# Patient Record
Sex: Male | Born: 1950
Health system: Southern US, Community
[De-identification: ages and names within clinical notes are randomized; demographics above are authoritative.]

## PROBLEM LIST (undated history)

## (undated) DIAGNOSIS — K56609 Unspecified intestinal obstruction, unspecified as to partial versus complete obstruction: Secondary | ICD-10-CM

## (undated) DIAGNOSIS — I219 Acute myocardial infarction, unspecified: Secondary | ICD-10-CM

## (undated) DIAGNOSIS — I2510 Atherosclerotic heart disease of native coronary artery without angina pectoris: Secondary | ICD-10-CM

## (undated) DIAGNOSIS — I10 Essential (primary) hypertension: Secondary | ICD-10-CM

## (undated) DIAGNOSIS — E041 Nontoxic single thyroid nodule: Secondary | ICD-10-CM

## (undated) DIAGNOSIS — N400 Enlarged prostate without lower urinary tract symptoms: Secondary | ICD-10-CM

## (undated) DIAGNOSIS — M549 Dorsalgia, unspecified: Secondary | ICD-10-CM

## (undated) DIAGNOSIS — R918 Other nonspecific abnormal finding of lung field: Secondary | ICD-10-CM

## (undated) DIAGNOSIS — C679 Malignant neoplasm of bladder, unspecified: Secondary | ICD-10-CM

## (undated) DIAGNOSIS — I771 Stricture of artery: Secondary | ICD-10-CM

## (undated) HISTORY — DX: Dorsalgia, unspecified: M54.9

## (undated) HISTORY — PX: CIRCUMCISION: SUR203

## (undated) HISTORY — DX: Stricture of artery: I77.1

## (undated) HISTORY — PX: CORONARY ANGIOPLASTY WITH STENT PLACEMENT: SHX49

## (undated) HISTORY — DX: Malignant neoplasm of bladder, unspecified: C67.9

## (undated) HISTORY — PX: TRANSURETHRAL RESECTION OF BLADDER TUMOR WITH GYRUS (TURBT-GYRUS): SHX6458

## (undated) HISTORY — DX: Benign prostatic hyperplasia without lower urinary tract symptoms: N40.0

## (undated) HISTORY — PX: CYSTECTOMY: SUR359

## (undated) HISTORY — DX: Atherosclerotic heart disease of native coronary artery without angina pectoris: I25.10

## (undated) HISTORY — DX: Essential (primary) hypertension: I10

## (undated) HISTORY — DX: Nontoxic single thyroid nodule: E04.1

## (undated) HISTORY — PX: NASAL SINUS SURGERY: SHX719

## (undated) HISTORY — DX: Acute myocardial infarction, unspecified: I21.9

## (undated) HISTORY — DX: Other nonspecific abnormal finding of lung field: R91.8

## (undated) HISTORY — DX: Unspecified intestinal obstruction, unspecified as to partial versus complete obstruction: K56.609

---

## 2009-10-07 ENCOUNTER — Ambulatory Visit: Payer: Self-pay | Admitting: Internal Medicine

## 2010-10-10 ENCOUNTER — Ambulatory Visit: Payer: Self-pay | Admitting: Urology

## 2010-11-15 ENCOUNTER — Ambulatory Visit: Payer: Self-pay | Admitting: Urology

## 2010-11-20 ENCOUNTER — Ambulatory Visit: Payer: Self-pay | Admitting: Urology

## 2010-11-22 LAB — PATHOLOGY REPORT

## 2011-01-24 DIAGNOSIS — C679 Malignant neoplasm of bladder, unspecified: Secondary | ICD-10-CM | POA: Diagnosis not present

## 2011-01-24 DIAGNOSIS — I10 Essential (primary) hypertension: Secondary | ICD-10-CM | POA: Diagnosis not present

## 2011-02-05 DIAGNOSIS — C679 Malignant neoplasm of bladder, unspecified: Secondary | ICD-10-CM | POA: Diagnosis not present

## 2011-02-05 DIAGNOSIS — I10 Essential (primary) hypertension: Secondary | ICD-10-CM | POA: Diagnosis not present

## 2011-03-05 ENCOUNTER — Ambulatory Visit: Payer: Self-pay | Admitting: Urology

## 2011-03-05 DIAGNOSIS — Z7982 Long term (current) use of aspirin: Secondary | ICD-10-CM | POA: Diagnosis not present

## 2011-03-05 DIAGNOSIS — N419 Inflammatory disease of prostate, unspecified: Secondary | ICD-10-CM | POA: Diagnosis not present

## 2011-03-05 DIAGNOSIS — M545 Low back pain, unspecified: Secondary | ICD-10-CM | POA: Diagnosis not present

## 2011-03-05 DIAGNOSIS — I10 Essential (primary) hypertension: Secondary | ICD-10-CM | POA: Diagnosis not present

## 2011-03-05 DIAGNOSIS — Z79899 Other long term (current) drug therapy: Secondary | ICD-10-CM | POA: Diagnosis not present

## 2011-03-05 DIAGNOSIS — Z9861 Coronary angioplasty status: Secondary | ICD-10-CM | POA: Diagnosis not present

## 2011-03-05 DIAGNOSIS — N400 Enlarged prostate without lower urinary tract symptoms: Secondary | ICD-10-CM | POA: Diagnosis not present

## 2011-03-05 DIAGNOSIS — I252 Old myocardial infarction: Secondary | ICD-10-CM | POA: Diagnosis not present

## 2011-03-05 DIAGNOSIS — F17200 Nicotine dependence, unspecified, uncomplicated: Secondary | ICD-10-CM | POA: Diagnosis not present

## 2011-03-05 DIAGNOSIS — C679 Malignant neoplasm of bladder, unspecified: Secondary | ICD-10-CM | POA: Diagnosis not present

## 2011-03-05 DIAGNOSIS — K3189 Other diseases of stomach and duodenum: Secondary | ICD-10-CM | POA: Diagnosis not present

## 2011-03-05 DIAGNOSIS — M129 Arthropathy, unspecified: Secondary | ICD-10-CM | POA: Diagnosis not present

## 2011-03-08 DIAGNOSIS — C679 Malignant neoplasm of bladder, unspecified: Secondary | ICD-10-CM | POA: Diagnosis not present

## 2011-03-08 DIAGNOSIS — I10 Essential (primary) hypertension: Secondary | ICD-10-CM | POA: Diagnosis not present

## 2011-03-08 LAB — PATHOLOGY REPORT

## 2011-03-12 DIAGNOSIS — F332 Major depressive disorder, recurrent severe without psychotic features: Secondary | ICD-10-CM | POA: Diagnosis not present

## 2011-03-12 DIAGNOSIS — F314 Bipolar disorder, current episode depressed, severe, without psychotic features: Secondary | ICD-10-CM | POA: Diagnosis not present

## 2011-03-14 DIAGNOSIS — C679 Malignant neoplasm of bladder, unspecified: Secondary | ICD-10-CM | POA: Diagnosis not present

## 2011-03-14 DIAGNOSIS — I10 Essential (primary) hypertension: Secondary | ICD-10-CM | POA: Diagnosis not present

## 2011-03-20 DIAGNOSIS — F4310 Post-traumatic stress disorder, unspecified: Secondary | ICD-10-CM | POA: Diagnosis not present

## 2011-03-28 DIAGNOSIS — I10 Essential (primary) hypertension: Secondary | ICD-10-CM | POA: Diagnosis not present

## 2011-03-28 DIAGNOSIS — C679 Malignant neoplasm of bladder, unspecified: Secondary | ICD-10-CM | POA: Diagnosis not present

## 2011-04-04 DIAGNOSIS — I10 Essential (primary) hypertension: Secondary | ICD-10-CM | POA: Diagnosis not present

## 2011-04-04 DIAGNOSIS — E785 Hyperlipidemia, unspecified: Secondary | ICD-10-CM | POA: Diagnosis not present

## 2011-04-04 DIAGNOSIS — K219 Gastro-esophageal reflux disease without esophagitis: Secondary | ICD-10-CM | POA: Diagnosis not present

## 2011-04-04 DIAGNOSIS — J309 Allergic rhinitis, unspecified: Secondary | ICD-10-CM | POA: Diagnosis not present

## 2011-05-01 DIAGNOSIS — F4310 Post-traumatic stress disorder, unspecified: Secondary | ICD-10-CM | POA: Diagnosis not present

## 2011-06-12 DIAGNOSIS — F332 Major depressive disorder, recurrent severe without psychotic features: Secondary | ICD-10-CM | POA: Diagnosis not present

## 2011-06-19 DIAGNOSIS — F4310 Post-traumatic stress disorder, unspecified: Secondary | ICD-10-CM | POA: Diagnosis not present

## 2011-06-27 DIAGNOSIS — M62838 Other muscle spasm: Secondary | ICD-10-CM | POA: Diagnosis not present

## 2011-06-27 DIAGNOSIS — I10 Essential (primary) hypertension: Secondary | ICD-10-CM | POA: Diagnosis not present

## 2011-06-27 DIAGNOSIS — C679 Malignant neoplasm of bladder, unspecified: Secondary | ICD-10-CM | POA: Diagnosis not present

## 2011-07-04 DIAGNOSIS — F3132 Bipolar disorder, current episode depressed, moderate: Secondary | ICD-10-CM | POA: Diagnosis not present

## 2011-07-04 DIAGNOSIS — F332 Major depressive disorder, recurrent severe without psychotic features: Secondary | ICD-10-CM | POA: Diagnosis not present

## 2011-07-04 DIAGNOSIS — F4310 Post-traumatic stress disorder, unspecified: Secondary | ICD-10-CM | POA: Diagnosis not present

## 2011-07-31 DIAGNOSIS — F4310 Post-traumatic stress disorder, unspecified: Secondary | ICD-10-CM | POA: Diagnosis not present

## 2011-08-21 DIAGNOSIS — M109 Gout, unspecified: Secondary | ICD-10-CM | POA: Diagnosis not present

## 2011-10-02 DIAGNOSIS — F4310 Post-traumatic stress disorder, unspecified: Secondary | ICD-10-CM | POA: Diagnosis not present

## 2011-10-17 DIAGNOSIS — C679 Malignant neoplasm of bladder, unspecified: Secondary | ICD-10-CM | POA: Diagnosis not present

## 2011-10-17 DIAGNOSIS — I10 Essential (primary) hypertension: Secondary | ICD-10-CM | POA: Diagnosis not present

## 2011-10-18 ENCOUNTER — Ambulatory Visit: Payer: Self-pay | Admitting: Urology

## 2011-10-18 DIAGNOSIS — Z01812 Encounter for preprocedural laboratory examination: Secondary | ICD-10-CM | POA: Diagnosis not present

## 2011-10-18 DIAGNOSIS — I10 Essential (primary) hypertension: Secondary | ICD-10-CM | POA: Diagnosis not present

## 2011-10-18 DIAGNOSIS — Z01810 Encounter for preprocedural cardiovascular examination: Secondary | ICD-10-CM | POA: Diagnosis not present

## 2011-10-18 DIAGNOSIS — I2510 Atherosclerotic heart disease of native coronary artery without angina pectoris: Secondary | ICD-10-CM | POA: Diagnosis not present

## 2011-10-18 LAB — CBC WITH DIFFERENTIAL/PLATELET
Basophil #: 0 10*3/uL (ref 0.0–0.1)
Eosinophil #: 0.1 10*3/uL (ref 0.0–0.7)
HCT: 40.7 % (ref 40.0–52.0)
Lymphocyte #: 2.4 10*3/uL (ref 1.0–3.6)
Lymphocyte %: 38.3 %
MCHC: 33.5 g/dL (ref 32.0–36.0)
MCV: 94 fL (ref 80–100)
Neutrophil #: 3.1 10*3/uL (ref 1.4–6.5)
Platelet: 209 10*3/uL (ref 150–440)
RDW: 15 % — ABNORMAL HIGH (ref 11.5–14.5)

## 2011-10-18 LAB — BASIC METABOLIC PANEL
BUN: 6 mg/dL — ABNORMAL LOW (ref 7–18)
Creatinine: 0.85 mg/dL (ref 0.60–1.30)
EGFR (Non-African Amer.): >60
Glucose: 102 mg/dL — ABNORMAL HIGH (ref 65–99)
Osmolality: 285 (ref 275–301)
Potassium: 4 mmol/L (ref 3.5–5.1)
Sodium: 144 mmol/L (ref 136–145)

## 2011-10-22 ENCOUNTER — Ambulatory Visit: Payer: Self-pay | Admitting: Urology

## 2011-10-22 DIAGNOSIS — Z7982 Long term (current) use of aspirin: Secondary | ICD-10-CM | POA: Diagnosis not present

## 2011-10-22 DIAGNOSIS — I10 Essential (primary) hypertension: Secondary | ICD-10-CM | POA: Diagnosis not present

## 2011-10-22 DIAGNOSIS — K3189 Other diseases of stomach and duodenum: Secondary | ICD-10-CM | POA: Diagnosis not present

## 2011-10-22 DIAGNOSIS — M545 Low back pain, unspecified: Secondary | ICD-10-CM | POA: Diagnosis not present

## 2011-10-22 DIAGNOSIS — I2510 Atherosclerotic heart disease of native coronary artery without angina pectoris: Secondary | ICD-10-CM | POA: Diagnosis not present

## 2011-10-22 DIAGNOSIS — F17200 Nicotine dependence, unspecified, uncomplicated: Secondary | ICD-10-CM | POA: Diagnosis not present

## 2011-10-22 DIAGNOSIS — Z9861 Coronary angioplasty status: Secondary | ICD-10-CM | POA: Diagnosis not present

## 2011-10-22 DIAGNOSIS — N419 Inflammatory disease of prostate, unspecified: Secondary | ICD-10-CM | POA: Diagnosis not present

## 2011-10-22 DIAGNOSIS — C679 Malignant neoplasm of bladder, unspecified: Secondary | ICD-10-CM | POA: Diagnosis not present

## 2011-10-22 DIAGNOSIS — N400 Enlarged prostate without lower urinary tract symptoms: Secondary | ICD-10-CM | POA: Diagnosis not present

## 2011-10-22 DIAGNOSIS — D494 Neoplasm of unspecified behavior of bladder: Secondary | ICD-10-CM | POA: Diagnosis not present

## 2011-10-22 DIAGNOSIS — Z79899 Other long term (current) drug therapy: Secondary | ICD-10-CM | POA: Diagnosis not present

## 2011-10-22 DIAGNOSIS — I252 Old myocardial infarction: Secondary | ICD-10-CM | POA: Diagnosis not present

## 2011-10-22 DIAGNOSIS — F4310 Post-traumatic stress disorder, unspecified: Secondary | ICD-10-CM | POA: Diagnosis not present

## 2011-11-20 DIAGNOSIS — F4310 Post-traumatic stress disorder, unspecified: Secondary | ICD-10-CM | POA: Diagnosis not present

## 2011-12-18 DIAGNOSIS — F4310 Post-traumatic stress disorder, unspecified: Secondary | ICD-10-CM | POA: Diagnosis not present

## 2012-02-13 DIAGNOSIS — F4310 Post-traumatic stress disorder, unspecified: Secondary | ICD-10-CM | POA: Diagnosis not present

## 2012-02-19 DIAGNOSIS — F4310 Post-traumatic stress disorder, unspecified: Secondary | ICD-10-CM | POA: Diagnosis not present

## 2012-04-02 DIAGNOSIS — C679 Malignant neoplasm of bladder, unspecified: Secondary | ICD-10-CM | POA: Diagnosis not present

## 2012-04-08 DIAGNOSIS — E785 Hyperlipidemia, unspecified: Secondary | ICD-10-CM | POA: Diagnosis not present

## 2012-04-08 DIAGNOSIS — Z0000 Encounter for general adult medical examination without abnormal findings: Secondary | ICD-10-CM | POA: Diagnosis not present

## 2012-04-08 DIAGNOSIS — Z23 Encounter for immunization: Secondary | ICD-10-CM | POA: Diagnosis not present

## 2012-04-08 DIAGNOSIS — I10 Essential (primary) hypertension: Secondary | ICD-10-CM | POA: Diagnosis not present

## 2012-04-08 DIAGNOSIS — Z125 Encounter for screening for malignant neoplasm of prostate: Secondary | ICD-10-CM | POA: Diagnosis not present

## 2012-04-15 DIAGNOSIS — F4310 Post-traumatic stress disorder, unspecified: Secondary | ICD-10-CM | POA: Diagnosis not present

## 2012-04-29 DIAGNOSIS — C679 Malignant neoplasm of bladder, unspecified: Secondary | ICD-10-CM | POA: Diagnosis not present

## 2012-04-29 DIAGNOSIS — N400 Enlarged prostate without lower urinary tract symptoms: Secondary | ICD-10-CM | POA: Diagnosis not present

## 2012-04-29 DIAGNOSIS — R972 Elevated prostate specific antigen [PSA]: Secondary | ICD-10-CM | POA: Diagnosis not present

## 2012-05-05 ENCOUNTER — Ambulatory Visit: Payer: Self-pay | Admitting: Urology

## 2012-05-05 DIAGNOSIS — R310 Gross hematuria: Secondary | ICD-10-CM | POA: Diagnosis not present

## 2012-05-05 DIAGNOSIS — R319 Hematuria, unspecified: Secondary | ICD-10-CM | POA: Diagnosis not present

## 2012-05-08 HISTORY — PX: INSERTION CENTRAL VENOUS ACCESS DEVICE W/ SUBCUTANEOUS PORT: SUR725

## 2012-05-13 ENCOUNTER — Ambulatory Visit: Payer: Self-pay | Admitting: Urology

## 2012-05-13 DIAGNOSIS — I771 Stricture of artery: Secondary | ICD-10-CM | POA: Diagnosis not present

## 2012-05-13 DIAGNOSIS — C679 Malignant neoplasm of bladder, unspecified: Secondary | ICD-10-CM | POA: Diagnosis not present

## 2012-05-13 DIAGNOSIS — I2510 Atherosclerotic heart disease of native coronary artery without angina pectoris: Secondary | ICD-10-CM | POA: Diagnosis not present

## 2012-05-13 DIAGNOSIS — Z01812 Encounter for preprocedural laboratory examination: Secondary | ICD-10-CM | POA: Diagnosis not present

## 2012-05-13 LAB — CBC WITH DIFFERENTIAL/PLATELET
Basophil #: 0 10*3/uL (ref 0.0–0.1)
Basophil %: 0.5 %
Eosinophil #: 0.1 10*3/uL (ref 0.0–0.7)
HGB: 13.2 g/dL (ref 13.0–18.0)
Lymphocyte %: 39.3 %
MCH: 30.8 pg (ref 26.0–34.0)
MCHC: 33.6 g/dL (ref 32.0–36.0)
MCV: 92 fL (ref 80–100)
Monocyte #: 0.6 x10 3/mm (ref 0.2–1.0)
Monocyte %: 10.6 %
Neutrophil #: 2.6 10*3/uL (ref 1.4–6.5)
Neutrophil %: 47.7 %
RBC: 4.29 10*6/uL — ABNORMAL LOW (ref 4.40–5.90)
RDW: 14.3 % (ref 11.5–14.5)

## 2012-05-13 LAB — BASIC METABOLIC PANEL
Anion Gap: 3 — ABNORMAL LOW (ref 7–16)
Calcium, Total: 9.2 mg/dL (ref 8.5–10.1)
Chloride: 107 mmol/L (ref 98–107)
Creatinine: 0.98 mg/dL (ref 0.60–1.30)
EGFR (African American): >60
Glucose: 78 mg/dL (ref 65–99)
Potassium: 4.2 mmol/L (ref 3.5–5.1)

## 2012-05-21 ENCOUNTER — Ambulatory Visit: Payer: Self-pay | Admitting: Urology

## 2012-05-21 DIAGNOSIS — C679 Malignant neoplasm of bladder, unspecified: Secondary | ICD-10-CM | POA: Diagnosis not present

## 2012-05-21 DIAGNOSIS — I252 Old myocardial infarction: Secondary | ICD-10-CM | POA: Diagnosis not present

## 2012-05-21 DIAGNOSIS — Z880 Allergy status to penicillin: Secondary | ICD-10-CM | POA: Diagnosis not present

## 2012-05-21 DIAGNOSIS — N329 Bladder disorder, unspecified: Secondary | ICD-10-CM | POA: Diagnosis not present

## 2012-05-21 DIAGNOSIS — F17200 Nicotine dependence, unspecified, uncomplicated: Secondary | ICD-10-CM | POA: Diagnosis not present

## 2012-05-21 DIAGNOSIS — I10 Essential (primary) hypertension: Secondary | ICD-10-CM | POA: Diagnosis not present

## 2012-05-21 DIAGNOSIS — N400 Enlarged prostate without lower urinary tract symptoms: Secondary | ICD-10-CM | POA: Diagnosis not present

## 2012-05-21 DIAGNOSIS — Z7982 Long term (current) use of aspirin: Secondary | ICD-10-CM | POA: Diagnosis not present

## 2012-05-21 DIAGNOSIS — Z79899 Other long term (current) drug therapy: Secondary | ICD-10-CM | POA: Diagnosis not present

## 2012-05-22 LAB — PATHOLOGY REPORT

## 2012-06-05 ENCOUNTER — Ambulatory Visit: Payer: Self-pay | Admitting: Hematology and Oncology

## 2012-06-05 DIAGNOSIS — C679 Malignant neoplasm of bladder, unspecified: Secondary | ICD-10-CM | POA: Diagnosis not present

## 2012-06-09 ENCOUNTER — Ambulatory Visit: Payer: Self-pay | Admitting: Urology

## 2012-06-09 DIAGNOSIS — J438 Other emphysema: Secondary | ICD-10-CM | POA: Diagnosis not present

## 2012-06-09 DIAGNOSIS — E785 Hyperlipidemia, unspecified: Secondary | ICD-10-CM | POA: Diagnosis not present

## 2012-06-09 DIAGNOSIS — I6529 Occlusion and stenosis of unspecified carotid artery: Secondary | ICD-10-CM | POA: Diagnosis not present

## 2012-06-09 DIAGNOSIS — I70219 Atherosclerosis of native arteries of extremities with intermittent claudication, unspecified extremity: Secondary | ICD-10-CM | POA: Diagnosis not present

## 2012-06-09 DIAGNOSIS — C679 Malignant neoplasm of bladder, unspecified: Secondary | ICD-10-CM | POA: Diagnosis not present

## 2012-06-09 DIAGNOSIS — R319 Hematuria, unspecified: Secondary | ICD-10-CM | POA: Diagnosis not present

## 2012-06-09 DIAGNOSIS — I10 Essential (primary) hypertension: Secondary | ICD-10-CM | POA: Diagnosis not present

## 2012-06-11 ENCOUNTER — Ambulatory Visit: Payer: Self-pay | Admitting: Hematology and Oncology

## 2012-06-11 DIAGNOSIS — K5900 Constipation, unspecified: Secondary | ICD-10-CM | POA: Diagnosis not present

## 2012-06-11 DIAGNOSIS — Z800 Family history of malignant neoplasm of digestive organs: Secondary | ICD-10-CM | POA: Diagnosis not present

## 2012-06-11 DIAGNOSIS — R300 Dysuria: Secondary | ICD-10-CM | POA: Diagnosis not present

## 2012-06-11 DIAGNOSIS — Z9889 Other specified postprocedural states: Secondary | ICD-10-CM | POA: Diagnosis not present

## 2012-06-11 DIAGNOSIS — R509 Fever, unspecified: Secondary | ICD-10-CM | POA: Diagnosis not present

## 2012-06-11 DIAGNOSIS — R319 Hematuria, unspecified: Secondary | ICD-10-CM | POA: Diagnosis not present

## 2012-06-11 DIAGNOSIS — F17200 Nicotine dependence, unspecified, uncomplicated: Secondary | ICD-10-CM | POA: Diagnosis not present

## 2012-06-11 DIAGNOSIS — C679 Malignant neoplasm of bladder, unspecified: Secondary | ICD-10-CM | POA: Diagnosis not present

## 2012-06-11 DIAGNOSIS — IMO0002 Reserved for concepts with insufficient information to code with codable children: Secondary | ICD-10-CM | POA: Diagnosis not present

## 2012-06-11 DIAGNOSIS — Z8041 Family history of malignant neoplasm of ovary: Secondary | ICD-10-CM | POA: Diagnosis not present

## 2012-06-11 DIAGNOSIS — Z8042 Family history of malignant neoplasm of prostate: Secondary | ICD-10-CM | POA: Diagnosis not present

## 2012-06-11 DIAGNOSIS — Z5111 Encounter for antineoplastic chemotherapy: Secondary | ICD-10-CM | POA: Diagnosis not present

## 2012-06-11 DIAGNOSIS — Z7982 Long term (current) use of aspirin: Secondary | ICD-10-CM | POA: Diagnosis not present

## 2012-06-11 DIAGNOSIS — R110 Nausea: Secondary | ICD-10-CM | POA: Diagnosis not present

## 2012-06-11 DIAGNOSIS — R42 Dizziness and giddiness: Secondary | ICD-10-CM | POA: Diagnosis not present

## 2012-06-11 DIAGNOSIS — Z79899 Other long term (current) drug therapy: Secondary | ICD-10-CM | POA: Diagnosis not present

## 2012-06-11 DIAGNOSIS — Z808 Family history of malignant neoplasm of other organs or systems: Secondary | ICD-10-CM | POA: Diagnosis not present

## 2012-06-17 DIAGNOSIS — F4310 Post-traumatic stress disorder, unspecified: Secondary | ICD-10-CM | POA: Diagnosis not present

## 2012-06-20 ENCOUNTER — Ambulatory Visit: Payer: Self-pay | Admitting: Vascular Surgery

## 2012-06-20 DIAGNOSIS — I519 Heart disease, unspecified: Secondary | ICD-10-CM | POA: Diagnosis not present

## 2012-06-20 DIAGNOSIS — Z79899 Other long term (current) drug therapy: Secondary | ICD-10-CM | POA: Diagnosis not present

## 2012-06-20 DIAGNOSIS — I10 Essential (primary) hypertension: Secondary | ICD-10-CM | POA: Diagnosis not present

## 2012-06-20 DIAGNOSIS — E785 Hyperlipidemia, unspecified: Secondary | ICD-10-CM | POA: Diagnosis not present

## 2012-06-20 DIAGNOSIS — C679 Malignant neoplasm of bladder, unspecified: Secondary | ICD-10-CM | POA: Diagnosis not present

## 2012-06-20 DIAGNOSIS — Z9889 Other specified postprocedural states: Secondary | ICD-10-CM | POA: Diagnosis not present

## 2012-06-23 DIAGNOSIS — Z5111 Encounter for antineoplastic chemotherapy: Secondary | ICD-10-CM | POA: Diagnosis not present

## 2012-06-23 DIAGNOSIS — R300 Dysuria: Secondary | ICD-10-CM | POA: Diagnosis not present

## 2012-06-23 DIAGNOSIS — C679 Malignant neoplasm of bladder, unspecified: Secondary | ICD-10-CM | POA: Diagnosis not present

## 2012-06-23 DIAGNOSIS — R319 Hematuria, unspecified: Secondary | ICD-10-CM | POA: Diagnosis not present

## 2012-06-23 DIAGNOSIS — R42 Dizziness and giddiness: Secondary | ICD-10-CM | POA: Diagnosis not present

## 2012-06-23 DIAGNOSIS — IMO0002 Reserved for concepts with insufficient information to code with codable children: Secondary | ICD-10-CM | POA: Diagnosis not present

## 2012-06-23 LAB — COMPREHENSIVE METABOLIC PANEL
Albumin: 3.4 g/dL (ref 3.4–5.0)
Alkaline Phosphatase: 76 U/L (ref 50–136)
Anion Gap: 7 (ref 7–16)
BUN: 10 mg/dL (ref 7–18)
Bilirubin,Total: 0.5 mg/dL (ref 0.2–1.0)
Calcium, Total: 9.5 mg/dL (ref 8.5–10.1)
Co2: 30 mmol/L (ref 21–32)
Glucose: 117 mg/dL — ABNORMAL HIGH (ref 65–99)
Osmolality: 272 (ref 275–301)
Potassium: 4.3 mmol/L (ref 3.5–5.1)
SGOT(AST): 26 U/L (ref 15–37)
Sodium: 136 mmol/L (ref 136–145)
Total Protein: 7.6 g/dL (ref 6.4–8.2)

## 2012-06-23 LAB — CBC CANCER CENTER
Basophil #: 0.1 x10 3/mm (ref 0.0–0.1)
Basophil %: 1.2 %
Eosinophil #: 0.1 x10 3/mm (ref 0.0–0.7)
HCT: 37.5 % — ABNORMAL LOW (ref 40.0–52.0)
MCH: 29.4 pg (ref 26.0–34.0)
MCHC: 32.3 g/dL (ref 32.0–36.0)
Monocyte #: 0.5 x10 3/mm (ref 0.2–1.0)
Monocyte %: 10 %
Neutrophil #: 2.3 x10 3/mm (ref 1.4–6.5)
Platelet: 202 x10 3/mm (ref 150–440)

## 2012-06-30 DIAGNOSIS — R300 Dysuria: Secondary | ICD-10-CM | POA: Diagnosis not present

## 2012-06-30 DIAGNOSIS — R319 Hematuria, unspecified: Secondary | ICD-10-CM | POA: Diagnosis not present

## 2012-06-30 DIAGNOSIS — C679 Malignant neoplasm of bladder, unspecified: Secondary | ICD-10-CM | POA: Diagnosis not present

## 2012-06-30 DIAGNOSIS — IMO0002 Reserved for concepts with insufficient information to code with codable children: Secondary | ICD-10-CM | POA: Diagnosis not present

## 2012-06-30 DIAGNOSIS — R42 Dizziness and giddiness: Secondary | ICD-10-CM | POA: Diagnosis not present

## 2012-06-30 DIAGNOSIS — Z5111 Encounter for antineoplastic chemotherapy: Secondary | ICD-10-CM | POA: Diagnosis not present

## 2012-06-30 LAB — URINALYSIS, COMPLETE
Bilirubin,UR: NEGATIVE
Ketone: NEGATIVE
Nitrite: NEGATIVE
Ph: 6 (ref 4.5–8.0)
Protein: 30
Specific Gravity: 1.005 (ref 1.003–1.030)

## 2012-06-30 LAB — CBC CANCER CENTER
Basophil #: 0 x10 3/mm (ref 0.0–0.1)
Basophil %: 0.3 %
Eosinophil #: 0 x10 3/mm (ref 0.0–0.7)
Eosinophil %: 1.2 %
Lymphocyte #: 1.6 x10 3/mm (ref 1.0–3.6)
MCH: 30 pg (ref 26.0–34.0)
Monocyte %: 4.8 %
Neutrophil #: 1.5 x10 3/mm (ref 1.4–6.5)
Platelet: 145 x10 3/mm — ABNORMAL LOW (ref 150–440)
RDW: 13.9 % (ref 11.5–14.5)

## 2012-06-30 LAB — BASIC METABOLIC PANEL
BUN: 13 mg/dL (ref 7–18)
Calcium, Total: 8.8 mg/dL (ref 8.5–10.1)
Chloride: 95 mmol/L — ABNORMAL LOW (ref 98–107)
EGFR (African American): >60
Glucose: 91 mg/dL (ref 65–99)
Potassium: 3.8 mmol/L (ref 3.5–5.1)
Sodium: 134 mmol/L — ABNORMAL LOW (ref 136–145)

## 2012-07-02 LAB — URINE CULTURE

## 2012-07-07 DIAGNOSIS — IMO0002 Reserved for concepts with insufficient information to code with codable children: Secondary | ICD-10-CM | POA: Diagnosis not present

## 2012-07-07 DIAGNOSIS — R300 Dysuria: Secondary | ICD-10-CM | POA: Diagnosis not present

## 2012-07-07 DIAGNOSIS — R319 Hematuria, unspecified: Secondary | ICD-10-CM | POA: Diagnosis not present

## 2012-07-07 DIAGNOSIS — C679 Malignant neoplasm of bladder, unspecified: Secondary | ICD-10-CM | POA: Diagnosis not present

## 2012-07-07 DIAGNOSIS — R42 Dizziness and giddiness: Secondary | ICD-10-CM | POA: Diagnosis not present

## 2012-07-07 DIAGNOSIS — Z5111 Encounter for antineoplastic chemotherapy: Secondary | ICD-10-CM | POA: Diagnosis not present

## 2012-07-07 LAB — CBC CANCER CENTER
Basophil %: 0.1 %
Eosinophil #: 0 x10 3/mm (ref 0.0–0.7)
MCHC: 34.2 g/dL (ref 32.0–36.0)
MCV: 90 fL (ref 80–100)
Neutrophil #: 0.4 x10 3/mm — ABNORMAL LOW (ref 1.4–6.5)
Neutrophil %: 19.8 %
Platelet: 68 x10 3/mm — ABNORMAL LOW (ref 150–440)
RBC: 3.39 10*6/uL — ABNORMAL LOW (ref 4.40–5.90)
RDW: 13.8 % (ref 11.5–14.5)
WBC: 2 x10 3/mm — CL (ref 3.8–10.6)

## 2012-07-08 ENCOUNTER — Ambulatory Visit: Payer: Self-pay | Admitting: Hematology and Oncology

## 2012-07-08 DIAGNOSIS — Z7982 Long term (current) use of aspirin: Secondary | ICD-10-CM | POA: Diagnosis not present

## 2012-07-08 DIAGNOSIS — Z79899 Other long term (current) drug therapy: Secondary | ICD-10-CM | POA: Diagnosis not present

## 2012-07-08 DIAGNOSIS — C679 Malignant neoplasm of bladder, unspecified: Secondary | ICD-10-CM | POA: Diagnosis not present

## 2012-07-08 DIAGNOSIS — F17200 Nicotine dependence, unspecified, uncomplicated: Secondary | ICD-10-CM | POA: Diagnosis not present

## 2012-07-08 DIAGNOSIS — R300 Dysuria: Secondary | ICD-10-CM | POA: Diagnosis not present

## 2012-07-08 DIAGNOSIS — Z5111 Encounter for antineoplastic chemotherapy: Secondary | ICD-10-CM | POA: Diagnosis not present

## 2012-07-14 DIAGNOSIS — B353 Tinea pedis: Secondary | ICD-10-CM | POA: Diagnosis not present

## 2012-07-14 LAB — CBC CANCER CENTER
Basophil #: 0 x10 3/mm (ref 0.0–0.1)
HCT: 34 % — ABNORMAL LOW (ref 40.0–52.0)
HGB: 11.5 g/dL — ABNORMAL LOW (ref 13.0–18.0)
Lymphocyte #: 1.9 x10 3/mm (ref 1.0–3.6)
Lymphocyte %: 33.7 %
MCH: 30.7 pg (ref 26.0–34.0)
MCHC: 33.9 g/dL (ref 32.0–36.0)
MCV: 91 fL (ref 80–100)
Neutrophil #: 2.5 x10 3/mm (ref 1.4–6.5)
Neutrophil %: 45.8 %
RDW: 14.6 % — ABNORMAL HIGH (ref 11.5–14.5)
WBC: 5.5 x10 3/mm (ref 3.8–10.6)

## 2012-07-14 LAB — COMPREHENSIVE METABOLIC PANEL
Albumin: 3.5 g/dL (ref 3.4–5.0)
Alkaline Phosphatase: 80 U/L (ref 50–136)
Anion Gap: 12 (ref 7–16)
BUN: 8 mg/dL (ref 7–18)
Calcium, Total: 9.6 mg/dL (ref 8.5–10.1)
Chloride: 102 mmol/L (ref 98–107)
Co2: 27 mmol/L (ref 21–32)
Creatinine: 1.05 mg/dL (ref 0.60–1.30)
EGFR (Non-African Amer.): >60
Glucose: 113 mg/dL — ABNORMAL HIGH (ref 65–99)
Osmolality: 280 (ref 275–301)

## 2012-07-14 LAB — URINALYSIS, COMPLETE
Ketone: NEGATIVE
Specific Gravity: 1.015 (ref 1.003–1.030)
Squamous Epithelial: NONE SEEN

## 2012-07-21 LAB — CBC CANCER CENTER
Basophil #: 0 x10 3/mm (ref 0.0–0.1)
HCT: 30.7 % — ABNORMAL LOW (ref 40.0–52.0)
HGB: 10.5 g/dL — ABNORMAL LOW (ref 13.0–18.0)
MCH: 30.3 pg (ref 26.0–34.0)
MCHC: 34.1 g/dL (ref 32.0–36.0)
MCV: 89 fL (ref 80–100)
Monocyte %: 14.3 %
Neutrophil #: 1.6 x10 3/mm (ref 1.4–6.5)
Neutrophil %: 39.1 %
Platelet: 363 x10 3/mm (ref 150–440)
RDW: 15 % — ABNORMAL HIGH (ref 11.5–14.5)

## 2012-07-21 LAB — BASIC METABOLIC PANEL
Anion Gap: 6 — ABNORMAL LOW (ref 7–16)
Co2: 31 mmol/L (ref 21–32)
Creatinine: 0.88 mg/dL (ref 0.60–1.30)
EGFR (Non-African Amer.): >60
Osmolality: 271 (ref 275–301)
Potassium: 4.2 mmol/L (ref 3.5–5.1)
Sodium: 136 mmol/L (ref 136–145)

## 2012-07-28 LAB — CBC CANCER CENTER
Basophil #: 0 "x10 3/mm "
Basophil %: 0.1 %
Eosinophil #: 0 "x10 3/mm "
Eosinophil %: 0.7 %
HCT: 28.9 % — ABNORMAL LOW
HGB: 10 g/dL — ABNORMAL LOW
Lymphocyte %: 36.6 %
Lymphs Abs: 1.9 "x10 3/mm "
MCH: 31 pg
MCHC: 34.6 g/dL
MCV: 90 fL
Monocyte #: 1.2 "x10 3/mm " — ABNORMAL HIGH
Monocyte %: 23.8 %
Neutrophil #: 2 "x10 3/mm "
Neutrophil %: 38.8 %
Platelet: 116 "x10 3/mm " — ABNORMAL LOW
RBC: 3.23 "x10 6/mm " — ABNORMAL LOW
RDW: 14.7 % — ABNORMAL HIGH
WBC: 5.1 "x10 3/mm "

## 2012-08-04 LAB — COMPREHENSIVE METABOLIC PANEL
Alkaline Phosphatase: 68 U/L (ref 50–136)
BUN: 6 mg/dL — ABNORMAL LOW (ref 7–18)
Calcium, Total: 8.9 mg/dL (ref 8.5–10.1)
Co2: 30 mmol/L (ref 21–32)
Creatinine: 0.92 mg/dL (ref 0.60–1.30)
EGFR (African American): >60
Glucose: 101 mg/dL — ABNORMAL HIGH (ref 65–99)
Osmolality: 273 (ref 275–301)
SGPT (ALT): 16 U/L (ref 12–78)
Sodium: 138 mmol/L (ref 136–145)
Total Protein: 7.3 g/dL (ref 6.4–8.2)

## 2012-08-04 LAB — CBC CANCER CENTER
Basophil #: 0 x10 3/mm (ref 0.0–0.1)
Basophil %: 0.4 %
Eosinophil %: 1.9 %
HCT: 28.2 % — ABNORMAL LOW (ref 40.0–52.0)
HGB: 9.5 g/dL — ABNORMAL LOW (ref 13.0–18.0)
Lymphocyte #: 1.7 x10 3/mm (ref 1.0–3.6)
MCH: 30.3 pg (ref 26.0–34.0)
MCHC: 33.8 g/dL (ref 32.0–36.0)
MCV: 90 fL (ref 80–100)
Neutrophil #: 2.1 x10 3/mm (ref 1.4–6.5)
Neutrophil %: 39 %
Platelet: 299 x10 3/mm (ref 150–440)
RBC: 3.14 10*6/uL — ABNORMAL LOW (ref 4.40–5.90)

## 2012-08-08 ENCOUNTER — Ambulatory Visit: Payer: Self-pay | Admitting: Hematology and Oncology

## 2012-08-11 LAB — CBC CANCER CENTER
Basophil %: 0.2 %
HCT: 27.3 % — ABNORMAL LOW (ref 40.0–52.0)
Lymphocyte %: 30.6 %
MCH: 30.4 pg (ref 26.0–34.0)
MCHC: 34.6 g/dL (ref 32.0–36.0)
MCV: 88 fL (ref 80–100)
Monocyte #: 0.6 x10 3/mm (ref 0.2–1.0)
Neutrophil %: 51 %
RBC: 3.1 10*6/uL — ABNORMAL LOW (ref 4.40–5.90)
RDW: 15.2 % — ABNORMAL HIGH (ref 11.5–14.5)
WBC: 3.7 x10 3/mm — ABNORMAL LOW (ref 3.8–10.6)

## 2012-08-11 LAB — BASIC METABOLIC PANEL
Anion Gap: 10 (ref 7–16)
BUN: 13 mg/dL (ref 7–18)
Calcium, Total: 8.5 mg/dL (ref 8.5–10.1)
Chloride: 94 mmol/L — ABNORMAL LOW (ref 98–107)
Co2: 28 mmol/L (ref 21–32)
Creatinine: 0.93 mg/dL (ref 0.60–1.30)
EGFR (Non-African Amer.): >60
Potassium: 3.9 mmol/L (ref 3.5–5.1)

## 2012-08-13 DIAGNOSIS — J209 Acute bronchitis, unspecified: Secondary | ICD-10-CM | POA: Diagnosis not present

## 2012-08-26 LAB — COMPREHENSIVE METABOLIC PANEL
Albumin: 3.2 g/dL — ABNORMAL LOW (ref 3.4–5.0)
BUN: 8 mg/dL (ref 7–18)
Bilirubin,Total: 0.3 mg/dL (ref 0.2–1.0)
Chloride: 99 mmol/L (ref 98–107)
Co2: 29 mmol/L (ref 21–32)
EGFR (African American): >60
EGFR (Non-African Amer.): >60
Osmolality: 269 (ref 275–301)
Potassium: 3.8 mmol/L (ref 3.5–5.1)
SGOT(AST): 18 U/L (ref 15–37)

## 2012-08-26 LAB — CBC CANCER CENTER
Basophil %: 0.5 %
HCT: 29.4 % — ABNORMAL LOW (ref 40.0–52.0)
HGB: 9.7 g/dL — ABNORMAL LOW (ref 13.0–18.0)
Lymphocyte %: 30.6 %
MCV: 90 fL (ref 80–100)
Monocyte #: 1.2 x10 3/mm — ABNORMAL HIGH (ref 0.2–1.0)
Neutrophil #: 2.7 x10 3/mm (ref 1.4–6.5)
Neutrophil %: 47 %
Platelet: 313 x10 3/mm (ref 150–440)
RDW: 17.4 % — ABNORMAL HIGH (ref 11.5–14.5)
WBC: 5.7 x10 3/mm (ref 3.8–10.6)

## 2012-08-26 LAB — MAGNESIUM: Magnesium: 1.8 mg/dL

## 2012-09-02 LAB — COMPREHENSIVE METABOLIC PANEL WITH GFR
Albumin: 3.1 g/dL — ABNORMAL LOW
Alkaline Phosphatase: 67 U/L
Anion Gap: 10
BUN: 6 mg/dL — ABNORMAL LOW
Bilirubin,Total: 0.2 mg/dL
Calcium, Total: 8.8 mg/dL
Chloride: 96 mmol/L — ABNORMAL LOW
Co2: 29 mmol/L
Creatinine: 0.97 mg/dL
EGFR (African American): >60
EGFR (Non-African Amer.): >60
Glucose: 98 mg/dL
Osmolality: 268
Potassium: 3.1 mmol/L — ABNORMAL LOW
SGOT(AST): 18 U/L
SGPT (ALT): 13 U/L
Sodium: 135 mmol/L — ABNORMAL LOW
Total Protein: 7.1 g/dL

## 2012-09-02 LAB — CBC CANCER CENTER
Eosinophil %: 0.9 %
Lymphocyte #: 1.9 x10 3/mm (ref 1.0–3.6)
MCH: 31.1 pg (ref 26.0–34.0)
MCHC: 34.8 g/dL (ref 32.0–36.0)
Monocyte #: 0.9 x10 3/mm (ref 0.2–1.0)
Monocyte %: 17.9 %
Neutrophil #: 2.2 x10 3/mm (ref 1.4–6.5)
Neutrophil %: 43.2 %
Platelet: 230 x10 3/mm (ref 150–440)
RBC: 2.88 10*6/uL — ABNORMAL LOW (ref 4.40–5.90)
RDW: 16.8 % — ABNORMAL HIGH (ref 11.5–14.5)
WBC: 5.1 x10 3/mm (ref 3.8–10.6)

## 2012-09-08 ENCOUNTER — Ambulatory Visit: Payer: Self-pay | Admitting: Hematology and Oncology

## 2012-09-08 DIAGNOSIS — Z7982 Long term (current) use of aspirin: Secondary | ICD-10-CM | POA: Diagnosis not present

## 2012-09-08 DIAGNOSIS — C679 Malignant neoplasm of bladder, unspecified: Secondary | ICD-10-CM | POA: Diagnosis not present

## 2012-09-08 DIAGNOSIS — Z5111 Encounter for antineoplastic chemotherapy: Secondary | ICD-10-CM | POA: Diagnosis not present

## 2012-09-08 DIAGNOSIS — R300 Dysuria: Secondary | ICD-10-CM | POA: Diagnosis not present

## 2012-09-08 DIAGNOSIS — Z79899 Other long term (current) drug therapy: Secondary | ICD-10-CM | POA: Diagnosis not present

## 2012-09-08 DIAGNOSIS — F17200 Nicotine dependence, unspecified, uncomplicated: Secondary | ICD-10-CM | POA: Diagnosis not present

## 2012-09-15 DIAGNOSIS — Z79899 Other long term (current) drug therapy: Secondary | ICD-10-CM | POA: Diagnosis not present

## 2012-09-15 DIAGNOSIS — C679 Malignant neoplasm of bladder, unspecified: Secondary | ICD-10-CM | POA: Diagnosis not present

## 2012-09-15 LAB — COMPREHENSIVE METABOLIC PANEL WITH GFR
Albumin: 3.3 g/dL — ABNORMAL LOW
Alkaline Phosphatase: 60 U/L
Anion Gap: 10
BUN: 8 mg/dL
Bilirubin,Total: 0.3 mg/dL
Calcium, Total: 9.2 mg/dL
Chloride: 104 mmol/L
Co2: 26 mmol/L
Creatinine: 0.84 mg/dL
EGFR (African American): >60
EGFR (Non-African Amer.): >60
Glucose: 69 mg/dL
Osmolality: 276
Potassium: 4 mmol/L
SGOT(AST): 13 U/L — ABNORMAL LOW
SGPT (ALT): 14 U/L
Sodium: 140 mmol/L
Total Protein: 7.6 g/dL

## 2012-09-15 LAB — CBC CANCER CENTER
Basophil #: 0 x10 3/mm (ref 0.0–0.1)
Eosinophil #: 0.1 x10 3/mm (ref 0.0–0.7)
Eosinophil %: 1.2 %
HCT: 26.4 % — ABNORMAL LOW (ref 40.0–52.0)
HGB: 8.6 g/dL — ABNORMAL LOW (ref 13.0–18.0)
Lymphocyte %: 34.9 %
MCV: 93 fL (ref 80–100)
Monocyte %: 23.2 %
Neutrophil %: 40.4 %

## 2012-09-22 LAB — CBC CANCER CENTER
Eosinophil #: 0 x10 3/mm (ref 0.0–0.7)
HGB: 8.2 g/dL — ABNORMAL LOW (ref 13.0–18.0)
Lymphocyte #: 1.6 x10 3/mm (ref 1.0–3.6)
Lymphocyte %: 44.6 %
MCH: 29.9 pg (ref 26.0–34.0)
MCV: 90 fL (ref 80–100)
Monocyte #: 0.5 x10 3/mm (ref 0.2–1.0)
Neutrophil #: 1.4 x10 3/mm (ref 1.4–6.5)
Platelet: 199 x10 3/mm (ref 150–440)
RDW: 18.2 % — ABNORMAL HIGH (ref 11.5–14.5)

## 2012-09-22 LAB — COMPREHENSIVE METABOLIC PANEL
Albumin: 3.2 g/dL — ABNORMAL LOW (ref 3.4–5.0)
Alkaline Phosphatase: 58 U/L (ref 50–136)
Anion Gap: 10 (ref 7–16)
Bilirubin,Total: 0.3 mg/dL (ref 0.2–1.0)
Chloride: 97 mmol/L — ABNORMAL LOW (ref 98–107)
Co2: 28 mmol/L (ref 21–32)
Creatinine: 1.13 mg/dL (ref 0.60–1.30)
EGFR (African American): >60
EGFR (Non-African Amer.): >60
Glucose: 89 mg/dL (ref 65–99)
Osmolality: 270 (ref 275–301)
Potassium: 3.7 mmol/L (ref 3.5–5.1)
SGOT(AST): 17 U/L (ref 15–37)
Sodium: 135 mmol/L — ABNORMAL LOW (ref 136–145)

## 2012-09-23 DIAGNOSIS — C679 Malignant neoplasm of bladder, unspecified: Secondary | ICD-10-CM | POA: Diagnosis not present

## 2012-10-06 DIAGNOSIS — C679 Malignant neoplasm of bladder, unspecified: Secondary | ICD-10-CM | POA: Diagnosis not present

## 2012-10-06 DIAGNOSIS — Z79899 Other long term (current) drug therapy: Secondary | ICD-10-CM | POA: Diagnosis not present

## 2012-10-06 LAB — CBC CANCER CENTER
Basophil #: 0 x10 3/mm (ref 0.0–0.1)
Basophil %: 0.3 %
HCT: 27 % — ABNORMAL LOW (ref 40.0–52.0)
HGB: 8.8 g/dL — ABNORMAL LOW (ref 13.0–18.0)
Lymphocyte #: 1.3 x10 3/mm (ref 1.0–3.6)
Lymphocyte %: 27.7 %
MCH: 30.2 pg (ref 26.0–34.0)
MCHC: 32.7 g/dL (ref 32.0–36.0)
MCV: 93 fL (ref 80–100)
Monocyte #: 1.2 x10 3/mm — ABNORMAL HIGH (ref 0.2–1.0)
Neutrophil #: 2.3 x10 3/mm (ref 1.4–6.5)
Neutrophil %: 46.8 %
Platelet: 186 x10 3/mm (ref 150–440)
RBC: 2.92 10*6/uL — ABNORMAL LOW (ref 4.40–5.90)

## 2012-10-06 LAB — COMPREHENSIVE METABOLIC PANEL
Albumin: 3.2 g/dL — ABNORMAL LOW (ref 3.4–5.0)
Alkaline Phosphatase: 60 U/L (ref 50–136)
Anion Gap: 10 (ref 7–16)
BUN: 10 mg/dL (ref 7–18)
Bilirubin,Total: 0.4 mg/dL (ref 0.2–1.0)
Calcium, Total: 8.9 mg/dL (ref 8.5–10.1)
Co2: 28 mmol/L (ref 21–32)
Glucose: 101 mg/dL — ABNORMAL HIGH (ref 65–99)
Osmolality: 277 (ref 275–301)
Sodium: 139 mmol/L (ref 136–145)
Total Protein: 7.1 g/dL (ref 6.4–8.2)

## 2012-10-08 ENCOUNTER — Ambulatory Visit: Payer: Self-pay | Admitting: Hematology and Oncology

## 2012-10-08 DIAGNOSIS — F17200 Nicotine dependence, unspecified, uncomplicated: Secondary | ICD-10-CM | POA: Diagnosis not present

## 2012-10-08 DIAGNOSIS — Z5111 Encounter for antineoplastic chemotherapy: Secondary | ICD-10-CM | POA: Diagnosis not present

## 2012-10-08 DIAGNOSIS — Z7982 Long term (current) use of aspirin: Secondary | ICD-10-CM | POA: Diagnosis not present

## 2012-10-08 DIAGNOSIS — Z79899 Other long term (current) drug therapy: Secondary | ICD-10-CM | POA: Diagnosis not present

## 2012-10-08 DIAGNOSIS — D649 Anemia, unspecified: Secondary | ICD-10-CM | POA: Diagnosis not present

## 2012-10-08 DIAGNOSIS — C679 Malignant neoplasm of bladder, unspecified: Secondary | ICD-10-CM | POA: Diagnosis not present

## 2012-10-13 LAB — BASIC METABOLIC PANEL
BUN: 15 mg/dL (ref 7–18)
Calcium, Total: 8.3 mg/dL — ABNORMAL LOW (ref 8.5–10.1)
Co2: 27 mmol/L (ref 21–32)
Creatinine: 1.23 mg/dL (ref 0.60–1.30)
EGFR (African American): >60
EGFR (Non-African Amer.): >60
Glucose: 172 mg/dL — ABNORMAL HIGH (ref 65–99)
Osmolality: 271 (ref 275–301)
Potassium: 3.2 mmol/L — ABNORMAL LOW (ref 3.5–5.1)

## 2012-10-13 LAB — CBC CANCER CENTER
Basophil #: 0 x10 3/mm (ref 0.0–0.1)
Eosinophil #: 0 x10 3/mm (ref 0.0–0.7)
HCT: 27.1 % — ABNORMAL LOW (ref 40.0–52.0)
Lymphocyte #: 1.6 x10 3/mm (ref 1.0–3.6)
MCHC: 33.4 g/dL (ref 32.0–36.0)
MCV: 91 fL (ref 80–100)
Neutrophil #: 2.7 x10 3/mm (ref 1.4–6.5)
Neutrophil %: 55 %
RBC: 2.98 10*6/uL — ABNORMAL LOW (ref 4.40–5.90)
RDW: 18.5 % — ABNORMAL HIGH (ref 11.5–14.5)

## 2012-10-17 DIAGNOSIS — IMO0002 Reserved for concepts with insufficient information to code with codable children: Secondary | ICD-10-CM | POA: Diagnosis not present

## 2012-10-17 DIAGNOSIS — C679 Malignant neoplasm of bladder, unspecified: Secondary | ICD-10-CM | POA: Diagnosis not present

## 2012-10-17 DIAGNOSIS — F4310 Post-traumatic stress disorder, unspecified: Secondary | ICD-10-CM | POA: Diagnosis not present

## 2012-10-17 LAB — POTASSIUM: Potassium: 4.5 mmol/L (ref 3.5–5.1)

## 2012-10-21 DIAGNOSIS — F4310 Post-traumatic stress disorder, unspecified: Secondary | ICD-10-CM | POA: Diagnosis not present

## 2012-10-30 DIAGNOSIS — C679 Malignant neoplasm of bladder, unspecified: Secondary | ICD-10-CM | POA: Diagnosis not present

## 2012-10-31 DIAGNOSIS — F4310 Post-traumatic stress disorder, unspecified: Secondary | ICD-10-CM | POA: Diagnosis not present

## 2012-11-03 DIAGNOSIS — Z23 Encounter for immunization: Secondary | ICD-10-CM | POA: Diagnosis not present

## 2012-11-03 LAB — COMPREHENSIVE METABOLIC PANEL
Albumin: 3.2 g/dL — ABNORMAL LOW (ref 3.4–5.0)
Alkaline Phosphatase: 67 U/L (ref 50–136)
BUN: 10 mg/dL (ref 7–18)
Bilirubin,Total: 0.3 mg/dL (ref 0.2–1.0)
Calcium, Total: 8.9 mg/dL (ref 8.5–10.1)
Creatinine: 0.97 mg/dL (ref 0.60–1.30)
EGFR (Non-African Amer.): >60
Glucose: 88 mg/dL (ref 65–99)
Osmolality: 272 (ref 275–301)
SGOT(AST): 14 U/L — ABNORMAL LOW (ref 15–37)
Sodium: 137 mmol/L (ref 136–145)
Total Protein: 7.5 g/dL (ref 6.4–8.2)

## 2012-11-03 LAB — CBC CANCER CENTER
Basophil #: 0 x10 3/mm (ref 0.0–0.1)
Basophil %: 0.3 %
Eosinophil #: 0 x10 3/mm (ref 0.0–0.7)
HCT: 27.7 % — ABNORMAL LOW (ref 40.0–52.0)
MCV: 94 fL (ref 80–100)
Monocyte #: 1.2 x10 3/mm — ABNORMAL HIGH (ref 0.2–1.0)
Neutrophil #: 3.7 x10 3/mm (ref 1.4–6.5)
Platelet: 326 x10 3/mm (ref 150–440)
RBC: 2.95 10*6/uL — ABNORMAL LOW (ref 4.40–5.90)
RDW: 19.3 % — ABNORMAL HIGH (ref 11.5–14.5)
WBC: 6.6 x10 3/mm (ref 3.8–10.6)

## 2012-11-08 ENCOUNTER — Ambulatory Visit: Payer: Self-pay | Admitting: Hematology and Oncology

## 2012-11-12 DIAGNOSIS — K219 Gastro-esophageal reflux disease without esophagitis: Secondary | ICD-10-CM | POA: Diagnosis not present

## 2012-11-12 DIAGNOSIS — C679 Malignant neoplasm of bladder, unspecified: Secondary | ICD-10-CM | POA: Diagnosis not present

## 2012-11-12 DIAGNOSIS — I739 Peripheral vascular disease, unspecified: Secondary | ICD-10-CM | POA: Diagnosis not present

## 2012-11-12 DIAGNOSIS — E7800 Pure hypercholesterolemia, unspecified: Secondary | ICD-10-CM | POA: Insufficient documentation

## 2012-11-12 DIAGNOSIS — I10 Essential (primary) hypertension: Secondary | ICD-10-CM | POA: Diagnosis not present

## 2012-11-12 DIAGNOSIS — J302 Other seasonal allergic rhinitis: Secondary | ICD-10-CM | POA: Insufficient documentation

## 2012-11-12 DIAGNOSIS — I2510 Atherosclerotic heart disease of native coronary artery without angina pectoris: Secondary | ICD-10-CM | POA: Diagnosis not present

## 2012-11-12 DIAGNOSIS — C7951 Secondary malignant neoplasm of bone: Secondary | ICD-10-CM

## 2012-11-17 DIAGNOSIS — N400 Enlarged prostate without lower urinary tract symptoms: Secondary | ICD-10-CM | POA: Diagnosis present

## 2012-11-17 DIAGNOSIS — I2510 Atherosclerotic heart disease of native coronary artery without angina pectoris: Secondary | ICD-10-CM | POA: Diagnosis present

## 2012-11-17 DIAGNOSIS — E43 Unspecified severe protein-calorie malnutrition: Secondary | ICD-10-CM | POA: Diagnosis present

## 2012-11-17 DIAGNOSIS — E7800 Pure hypercholesterolemia, unspecified: Secondary | ICD-10-CM | POA: Diagnosis present

## 2012-11-17 DIAGNOSIS — I739 Peripheral vascular disease, unspecified: Secondary | ICD-10-CM | POA: Diagnosis present

## 2012-11-17 DIAGNOSIS — Z9221 Personal history of antineoplastic chemotherapy: Secondary | ICD-10-CM | POA: Diagnosis not present

## 2012-11-17 DIAGNOSIS — K219 Gastro-esophageal reflux disease without esophagitis: Secondary | ICD-10-CM | POA: Diagnosis present

## 2012-11-17 DIAGNOSIS — IMO0002 Reserved for concepts with insufficient information to code with codable children: Secondary | ICD-10-CM | POA: Diagnosis not present

## 2012-11-17 DIAGNOSIS — C679 Malignant neoplasm of bladder, unspecified: Secondary | ICD-10-CM | POA: Diagnosis not present

## 2012-11-17 DIAGNOSIS — I10 Essential (primary) hypertension: Secondary | ICD-10-CM | POA: Diagnosis present

## 2012-12-08 ENCOUNTER — Ambulatory Visit: Payer: Self-pay | Admitting: Hematology and Oncology

## 2012-12-08 DIAGNOSIS — C679 Malignant neoplasm of bladder, unspecified: Secondary | ICD-10-CM | POA: Diagnosis not present

## 2012-12-08 DIAGNOSIS — D649 Anemia, unspecified: Secondary | ICD-10-CM | POA: Diagnosis not present

## 2012-12-08 DIAGNOSIS — I739 Peripheral vascular disease, unspecified: Secondary | ICD-10-CM | POA: Diagnosis not present

## 2012-12-08 DIAGNOSIS — Z9221 Personal history of antineoplastic chemotherapy: Secondary | ICD-10-CM | POA: Diagnosis not present

## 2012-12-08 DIAGNOSIS — Z9079 Acquired absence of other genital organ(s): Secondary | ICD-10-CM | POA: Diagnosis not present

## 2012-12-08 DIAGNOSIS — Z79899 Other long term (current) drug therapy: Secondary | ICD-10-CM | POA: Diagnosis not present

## 2012-12-08 DIAGNOSIS — Z906 Acquired absence of other parts of urinary tract: Secondary | ICD-10-CM | POA: Diagnosis not present

## 2012-12-08 DIAGNOSIS — Z7982 Long term (current) use of aspirin: Secondary | ICD-10-CM | POA: Diagnosis not present

## 2012-12-09 DIAGNOSIS — N390 Urinary tract infection, site not specified: Secondary | ICD-10-CM | POA: Diagnosis not present

## 2012-12-09 DIAGNOSIS — C679 Malignant neoplasm of bladder, unspecified: Secondary | ICD-10-CM | POA: Diagnosis not present

## 2012-12-15 DIAGNOSIS — D649 Anemia, unspecified: Secondary | ICD-10-CM | POA: Diagnosis not present

## 2012-12-15 DIAGNOSIS — C679 Malignant neoplasm of bladder, unspecified: Secondary | ICD-10-CM | POA: Diagnosis not present

## 2012-12-15 DIAGNOSIS — Z79899 Other long term (current) drug therapy: Secondary | ICD-10-CM | POA: Diagnosis not present

## 2012-12-15 DIAGNOSIS — I739 Peripheral vascular disease, unspecified: Secondary | ICD-10-CM | POA: Diagnosis not present

## 2012-12-15 LAB — CBC CANCER CENTER
Eosinophil #: 0.1 x10 3/mm (ref 0.0–0.7)
Eosinophil %: 2.9 %
HCT: 30.7 % — ABNORMAL LOW (ref 40.0–52.0)
Lymphocyte #: 1.5 x10 3/mm (ref 1.0–3.6)
Lymphocyte %: 34.1 %
MCH: 29.7 pg (ref 26.0–34.0)
MCV: 90 fL (ref 80–100)
Monocyte #: 0.6 x10 3/mm (ref 0.2–1.0)
Monocyte %: 12.2 %
Neutrophil #: 2.3 x10 3/mm (ref 1.4–6.5)
Neutrophil %: 50.4 %
RBC: 3.4 10*6/uL — ABNORMAL LOW (ref 4.40–5.90)
RDW: 18.4 % — ABNORMAL HIGH (ref 11.5–14.5)
WBC: 4.5 x10 3/mm (ref 3.8–10.6)

## 2012-12-15 LAB — COMPREHENSIVE METABOLIC PANEL
Albumin: 3.4 g/dL (ref 3.4–5.0)
Alkaline Phosphatase: 58 U/L
Anion Gap: 8 (ref 7–16)
BUN: 13 mg/dL (ref 7–18)
Co2: 28 mmol/L (ref 21–32)
Creatinine: 1.12 mg/dL (ref 0.60–1.30)
EGFR (Non-African Amer.): >60
Glucose: 93 mg/dL (ref 65–99)
Osmolality: 270 (ref 275–301)
SGOT(AST): 23 U/L (ref 15–37)
Sodium: 135 mmol/L — ABNORMAL LOW (ref 136–145)

## 2012-12-18 DIAGNOSIS — C679 Malignant neoplasm of bladder, unspecified: Secondary | ICD-10-CM | POA: Diagnosis not present

## 2013-01-08 ENCOUNTER — Ambulatory Visit: Payer: Self-pay | Admitting: Hematology and Oncology

## 2013-02-11 DIAGNOSIS — J019 Acute sinusitis, unspecified: Secondary | ICD-10-CM | POA: Diagnosis not present

## 2013-02-20 DIAGNOSIS — H9209 Otalgia, unspecified ear: Secondary | ICD-10-CM | POA: Diagnosis not present

## 2013-02-20 DIAGNOSIS — H9319 Tinnitus, unspecified ear: Secondary | ICD-10-CM | POA: Diagnosis not present

## 2013-02-20 DIAGNOSIS — H905 Unspecified sensorineural hearing loss: Secondary | ICD-10-CM | POA: Diagnosis not present

## 2013-02-20 DIAGNOSIS — H903 Sensorineural hearing loss, bilateral: Secondary | ICD-10-CM | POA: Diagnosis not present

## 2013-03-13 ENCOUNTER — Ambulatory Visit: Payer: Self-pay | Admitting: Hematology and Oncology

## 2013-03-13 DIAGNOSIS — Z79899 Other long term (current) drug therapy: Secondary | ICD-10-CM | POA: Diagnosis not present

## 2013-03-13 DIAGNOSIS — C679 Malignant neoplasm of bladder, unspecified: Secondary | ICD-10-CM | POA: Diagnosis not present

## 2013-03-13 DIAGNOSIS — I739 Peripheral vascular disease, unspecified: Secondary | ICD-10-CM | POA: Diagnosis not present

## 2013-03-13 DIAGNOSIS — Z7982 Long term (current) use of aspirin: Secondary | ICD-10-CM | POA: Diagnosis not present

## 2013-03-13 DIAGNOSIS — F17200 Nicotine dependence, unspecified, uncomplicated: Secondary | ICD-10-CM | POA: Diagnosis not present

## 2013-03-13 DIAGNOSIS — Z800 Family history of malignant neoplasm of digestive organs: Secondary | ICD-10-CM | POA: Diagnosis not present

## 2013-03-16 DIAGNOSIS — Z7982 Long term (current) use of aspirin: Secondary | ICD-10-CM | POA: Diagnosis not present

## 2013-03-16 DIAGNOSIS — I739 Peripheral vascular disease, unspecified: Secondary | ICD-10-CM | POA: Diagnosis not present

## 2013-03-16 DIAGNOSIS — Z79899 Other long term (current) drug therapy: Secondary | ICD-10-CM | POA: Diagnosis not present

## 2013-03-16 DIAGNOSIS — C679 Malignant neoplasm of bladder, unspecified: Secondary | ICD-10-CM | POA: Diagnosis not present

## 2013-03-16 LAB — CBC CANCER CENTER
BASOS PCT: 0.3 %
Basophil #: 0 x10 3/mm (ref 0.0–0.1)
EOS PCT: 1.3 %
Eosinophil #: 0.1 x10 3/mm (ref 0.0–0.7)
HCT: 35.9 % — ABNORMAL LOW (ref 40.0–52.0)
HGB: 11.6 g/dL — AB (ref 13.0–18.0)
Lymphocyte #: 1.6 x10 3/mm (ref 1.0–3.6)
Lymphocyte %: 31.2 %
MCH: 30.9 pg (ref 26.0–34.0)
MCHC: 32.2 g/dL (ref 32.0–36.0)
MCV: 96 fL (ref 80–100)
Monocyte #: 0.5 x10 3/mm (ref 0.2–1.0)
Monocyte %: 9.8 %
NEUTROS PCT: 57.4 %
Neutrophil #: 3 x10 3/mm (ref 1.4–6.5)
Platelet: 158 x10 3/mm (ref 150–440)
RBC: 3.74 10*6/uL — ABNORMAL LOW (ref 4.40–5.90)
RDW: 17.9 % — ABNORMAL HIGH (ref 11.5–14.5)
WBC: 5.3 x10 3/mm (ref 3.8–10.6)

## 2013-03-16 LAB — COMPREHENSIVE METABOLIC PANEL
ANION GAP: 9 (ref 7–16)
Albumin: 3.6 g/dL (ref 3.4–5.0)
Alkaline Phosphatase: 53 U/L
BILIRUBIN TOTAL: 0.4 mg/dL (ref 0.2–1.0)
BUN: 11 mg/dL (ref 7–18)
CHLORIDE: 103 mmol/L (ref 98–107)
Calcium, Total: 8.9 mg/dL (ref 8.5–10.1)
Co2: 28 mmol/L (ref 21–32)
Creatinine: 1.08 mg/dL (ref 0.60–1.30)
Glucose: 105 mg/dL — ABNORMAL HIGH (ref 65–99)
Osmolality: 279 (ref 275–301)
Potassium: 3.8 mmol/L (ref 3.5–5.1)
SGOT(AST): 15 U/L (ref 15–37)
SGPT (ALT): 20 U/L (ref 12–78)
SODIUM: 140 mmol/L (ref 136–145)
Total Protein: 7.3 g/dL (ref 6.4–8.2)

## 2013-03-17 DIAGNOSIS — K219 Gastro-esophageal reflux disease without esophagitis: Secondary | ICD-10-CM | POA: Diagnosis not present

## 2013-03-31 DIAGNOSIS — M719 Bursopathy, unspecified: Secondary | ICD-10-CM | POA: Diagnosis not present

## 2013-03-31 DIAGNOSIS — M67919 Unspecified disorder of synovium and tendon, unspecified shoulder: Secondary | ICD-10-CM | POA: Diagnosis not present

## 2013-03-31 DIAGNOSIS — IMO0002 Reserved for concepts with insufficient information to code with codable children: Secondary | ICD-10-CM | POA: Diagnosis not present

## 2013-04-08 ENCOUNTER — Ambulatory Visit: Payer: Self-pay | Admitting: Hematology and Oncology

## 2013-04-08 DIAGNOSIS — Z906 Acquired absence of other parts of urinary tract: Secondary | ICD-10-CM | POA: Diagnosis not present

## 2013-04-08 DIAGNOSIS — Z79899 Other long term (current) drug therapy: Secondary | ICD-10-CM | POA: Diagnosis not present

## 2013-04-08 DIAGNOSIS — C679 Malignant neoplasm of bladder, unspecified: Secondary | ICD-10-CM | POA: Diagnosis not present

## 2013-04-08 DIAGNOSIS — I252 Old myocardial infarction: Secondary | ICD-10-CM | POA: Diagnosis not present

## 2013-04-08 DIAGNOSIS — Z800 Family history of malignant neoplasm of digestive organs: Secondary | ICD-10-CM | POA: Diagnosis not present

## 2013-04-08 DIAGNOSIS — I739 Peripheral vascular disease, unspecified: Secondary | ICD-10-CM | POA: Diagnosis not present

## 2013-04-08 DIAGNOSIS — R5381 Other malaise: Secondary | ICD-10-CM | POA: Diagnosis not present

## 2013-04-08 DIAGNOSIS — F17200 Nicotine dependence, unspecified, uncomplicated: Secondary | ICD-10-CM | POA: Diagnosis not present

## 2013-04-08 DIAGNOSIS — I7090 Unspecified atherosclerosis: Secondary | ICD-10-CM | POA: Diagnosis not present

## 2013-04-08 DIAGNOSIS — I10 Essential (primary) hypertension: Secondary | ICD-10-CM | POA: Diagnosis not present

## 2013-04-27 LAB — CBC CANCER CENTER
BASOS PCT: 0.9 %
Basophil #: 0.1 x10 3/mm (ref 0.0–0.1)
EOS PCT: 0.7 %
Eosinophil #: 0 x10 3/mm (ref 0.0–0.7)
HCT: 34.4 % — ABNORMAL LOW (ref 40.0–52.0)
HGB: 11.3 g/dL — ABNORMAL LOW (ref 13.0–18.0)
Lymphocyte #: 1.9 x10 3/mm (ref 1.0–3.6)
Lymphocyte %: 31.9 %
MCH: 31.4 pg (ref 26.0–34.0)
MCHC: 32.8 g/dL (ref 32.0–36.0)
MCV: 96 fL (ref 80–100)
MONOS PCT: 9.9 %
Monocyte #: 0.6 x10 3/mm (ref 0.2–1.0)
NEUTROS ABS: 3.3 x10 3/mm (ref 1.4–6.5)
NEUTROS PCT: 56.6 %
PLATELETS: 246 x10 3/mm (ref 150–440)
RBC: 3.61 10*6/uL — ABNORMAL LOW (ref 4.40–5.90)
RDW: 14.5 % (ref 11.5–14.5)
WBC: 5.9 x10 3/mm (ref 3.8–10.6)

## 2013-05-08 ENCOUNTER — Ambulatory Visit: Payer: Self-pay | Admitting: Hematology and Oncology

## 2013-06-08 ENCOUNTER — Ambulatory Visit: Payer: Self-pay | Admitting: Hematology and Oncology

## 2013-06-08 DIAGNOSIS — C679 Malignant neoplasm of bladder, unspecified: Secondary | ICD-10-CM | POA: Diagnosis not present

## 2013-06-08 DIAGNOSIS — Z452 Encounter for adjustment and management of vascular access device: Secondary | ICD-10-CM | POA: Diagnosis not present

## 2013-06-25 DIAGNOSIS — C679 Malignant neoplasm of bladder, unspecified: Secondary | ICD-10-CM | POA: Diagnosis not present

## 2013-07-08 ENCOUNTER — Ambulatory Visit: Payer: Self-pay | Admitting: Hematology and Oncology

## 2013-07-08 DIAGNOSIS — N400 Enlarged prostate without lower urinary tract symptoms: Secondary | ICD-10-CM | POA: Diagnosis not present

## 2013-07-08 DIAGNOSIS — Z79899 Other long term (current) drug therapy: Secondary | ICD-10-CM | POA: Diagnosis not present

## 2013-07-08 DIAGNOSIS — Z906 Acquired absence of other parts of urinary tract: Secondary | ICD-10-CM | POA: Diagnosis not present

## 2013-07-08 DIAGNOSIS — Z9221 Personal history of antineoplastic chemotherapy: Secondary | ICD-10-CM | POA: Diagnosis not present

## 2013-07-08 DIAGNOSIS — I252 Old myocardial infarction: Secondary | ICD-10-CM | POA: Diagnosis not present

## 2013-07-08 DIAGNOSIS — I70219 Atherosclerosis of native arteries of extremities with intermittent claudication, unspecified extremity: Secondary | ICD-10-CM | POA: Diagnosis not present

## 2013-07-08 DIAGNOSIS — I10 Essential (primary) hypertension: Secondary | ICD-10-CM | POA: Diagnosis not present

## 2013-07-08 DIAGNOSIS — F17200 Nicotine dependence, unspecified, uncomplicated: Secondary | ICD-10-CM | POA: Diagnosis not present

## 2013-07-08 DIAGNOSIS — Z800 Family history of malignant neoplasm of digestive organs: Secondary | ICD-10-CM | POA: Diagnosis not present

## 2013-07-08 DIAGNOSIS — C679 Malignant neoplasm of bladder, unspecified: Secondary | ICD-10-CM | POA: Diagnosis not present

## 2013-07-20 DIAGNOSIS — C679 Malignant neoplasm of bladder, unspecified: Secondary | ICD-10-CM | POA: Diagnosis not present

## 2013-07-20 LAB — CBC CANCER CENTER
BASOS ABS: 0 x10 3/mm (ref 0.0–0.1)
BASOS PCT: 0.3 %
Eosinophil #: 0 x10 3/mm (ref 0.0–0.7)
Eosinophil %: 0.8 %
HCT: 36.4 % — AB (ref 40.0–52.0)
HGB: 12.1 g/dL — AB (ref 13.0–18.0)
LYMPHS PCT: 40.9 %
Lymphocyte #: 2.3 x10 3/mm (ref 1.0–3.6)
MCH: 31.1 pg (ref 26.0–34.0)
MCHC: 33.2 g/dL (ref 32.0–36.0)
MCV: 94 fL (ref 80–100)
MONO ABS: 0.8 x10 3/mm (ref 0.2–1.0)
MONOS PCT: 13.5 %
NEUTROS ABS: 2.5 x10 3/mm (ref 1.4–6.5)
NEUTROS PCT: 44.5 %
Platelet: 215 x10 3/mm (ref 150–440)
RBC: 3.89 10*6/uL — ABNORMAL LOW (ref 4.40–5.90)
RDW: 15.5 % — ABNORMAL HIGH (ref 11.5–14.5)
WBC: 5.7 x10 3/mm (ref 3.8–10.6)

## 2013-07-20 LAB — COMPREHENSIVE METABOLIC PANEL
ALBUMIN: 3.4 g/dL (ref 3.4–5.0)
ALK PHOS: 71 U/L
ALT: 16 U/L (ref 12–78)
Anion Gap: 8 (ref 7–16)
BILIRUBIN TOTAL: 0.5 mg/dL (ref 0.2–1.0)
BUN: 12 mg/dL (ref 7–18)
Calcium, Total: 9 mg/dL (ref 8.5–10.1)
Chloride: 101 mmol/L (ref 98–107)
Co2: 30 mmol/L (ref 21–32)
Creatinine: 1.14 mg/dL (ref 0.60–1.30)
EGFR (African American): >60
EGFR (Non-African Amer.): >60
GLUCOSE: 72 mg/dL (ref 65–99)
OSMOLALITY: 276 (ref 275–301)
Potassium: 3.8 mmol/L (ref 3.5–5.1)
SGOT(AST): 14 U/L — ABNORMAL LOW (ref 15–37)
Sodium: 139 mmol/L (ref 136–145)
Total Protein: 7.9 g/dL (ref 6.4–8.2)

## 2013-07-21 LAB — PSA

## 2013-07-23 DIAGNOSIS — C679 Malignant neoplasm of bladder, unspecified: Secondary | ICD-10-CM | POA: Diagnosis not present

## 2013-08-08 ENCOUNTER — Ambulatory Visit: Payer: Self-pay | Admitting: Oncology

## 2013-08-08 ENCOUNTER — Ambulatory Visit: Payer: Self-pay | Admitting: Hematology and Oncology

## 2013-08-08 DIAGNOSIS — C679 Malignant neoplasm of bladder, unspecified: Secondary | ICD-10-CM | POA: Diagnosis not present

## 2013-08-08 DIAGNOSIS — Z452 Encounter for adjustment and management of vascular access device: Secondary | ICD-10-CM | POA: Diagnosis not present

## 2013-08-18 DIAGNOSIS — F4310 Post-traumatic stress disorder, unspecified: Secondary | ICD-10-CM | POA: Diagnosis not present

## 2013-09-08 ENCOUNTER — Ambulatory Visit: Payer: Self-pay | Admitting: Hematology and Oncology

## 2013-09-23 DIAGNOSIS — E785 Hyperlipidemia, unspecified: Secondary | ICD-10-CM | POA: Diagnosis not present

## 2013-09-23 DIAGNOSIS — I10 Essential (primary) hypertension: Secondary | ICD-10-CM | POA: Diagnosis not present

## 2013-09-23 DIAGNOSIS — J40 Bronchitis, not specified as acute or chronic: Secondary | ICD-10-CM | POA: Diagnosis not present

## 2013-09-29 DIAGNOSIS — J309 Allergic rhinitis, unspecified: Secondary | ICD-10-CM | POA: Diagnosis not present

## 2013-09-29 DIAGNOSIS — Z87898 Personal history of other specified conditions: Secondary | ICD-10-CM | POA: Insufficient documentation

## 2013-09-29 DIAGNOSIS — C61 Malignant neoplasm of prostate: Secondary | ICD-10-CM | POA: Diagnosis not present

## 2013-09-29 DIAGNOSIS — Z8546 Personal history of malignant neoplasm of prostate: Secondary | ICD-10-CM | POA: Insufficient documentation

## 2013-09-29 DIAGNOSIS — C671 Malignant neoplasm of dome of bladder: Secondary | ICD-10-CM | POA: Diagnosis not present

## 2013-09-29 DIAGNOSIS — N529 Male erectile dysfunction, unspecified: Secondary | ICD-10-CM | POA: Diagnosis not present

## 2013-10-24 DIAGNOSIS — R10819 Abdominal tenderness, unspecified site: Secondary | ICD-10-CM | POA: Diagnosis not present

## 2013-10-24 DIAGNOSIS — D638 Anemia in other chronic diseases classified elsewhere: Secondary | ICD-10-CM | POA: Insufficient documentation

## 2013-10-24 DIAGNOSIS — R100 Acute abdomen: Secondary | ICD-10-CM | POA: Diagnosis not present

## 2013-10-25 ENCOUNTER — Ambulatory Visit: Payer: Self-pay

## 2013-11-23 ENCOUNTER — Ambulatory Visit: Payer: Self-pay | Admitting: Hematology and Oncology

## 2013-11-23 DIAGNOSIS — Z906 Acquired absence of other parts of urinary tract: Secondary | ICD-10-CM | POA: Diagnosis not present

## 2013-11-23 DIAGNOSIS — Z9221 Personal history of antineoplastic chemotherapy: Secondary | ICD-10-CM | POA: Diagnosis not present

## 2013-11-23 DIAGNOSIS — C679 Malignant neoplasm of bladder, unspecified: Secondary | ICD-10-CM | POA: Diagnosis not present

## 2013-11-23 DIAGNOSIS — F17210 Nicotine dependence, cigarettes, uncomplicated: Secondary | ICD-10-CM | POA: Diagnosis not present

## 2013-11-23 DIAGNOSIS — I70209 Unspecified atherosclerosis of native arteries of extremities, unspecified extremity: Secondary | ICD-10-CM | POA: Diagnosis not present

## 2013-11-23 DIAGNOSIS — Z79899 Other long term (current) drug therapy: Secondary | ICD-10-CM | POA: Diagnosis not present

## 2013-11-23 DIAGNOSIS — N135 Crossing vessel and stricture of ureter without hydronephrosis: Secondary | ICD-10-CM | POA: Diagnosis not present

## 2013-11-23 DIAGNOSIS — Z9350 Unspecified cystostomy status: Secondary | ICD-10-CM | POA: Diagnosis not present

## 2013-11-23 DIAGNOSIS — R972 Elevated prostate specific antigen [PSA]: Secondary | ICD-10-CM | POA: Diagnosis not present

## 2013-11-23 DIAGNOSIS — K5660 Unspecified intestinal obstruction: Secondary | ICD-10-CM | POA: Diagnosis not present

## 2013-11-23 LAB — COMPREHENSIVE METABOLIC PANEL
AST: 16 U/L (ref 15–37)
Albumin: 3.7 g/dL (ref 3.4–5.0)
Alkaline Phosphatase: 69 U/L
Anion Gap: 9 (ref 7–16)
BUN: 19 mg/dL — ABNORMAL HIGH (ref 7–18)
Bilirubin,Total: 0.4 mg/dL (ref 0.2–1.0)
CALCIUM: 9.5 mg/dL (ref 8.5–10.1)
CO2: 27 mmol/L (ref 21–32)
CREATININE: 1.15 mg/dL (ref 0.60–1.30)
Chloride: 99 mmol/L (ref 98–107)
EGFR (African American): >60
EGFR (Non-African Amer.): >60
Glucose: 88 mg/dL (ref 65–99)
Osmolality: 272 (ref 275–301)
Potassium: 3.9 mmol/L (ref 3.5–5.1)
SGPT (ALT): 19 U/L
Sodium: 135 mmol/L — ABNORMAL LOW (ref 136–145)
TOTAL PROTEIN: 7.7 g/dL (ref 6.4–8.2)

## 2013-11-23 LAB — CBC CANCER CENTER
Basophil #: 0 x10 3/mm (ref 0.0–0.1)
Basophil %: 0.2 %
EOS ABS: 0 x10 3/mm (ref 0.0–0.7)
EOS PCT: 0.7 %
HCT: 37.6 % — AB (ref 40.0–52.0)
HGB: 12.3 g/dL — ABNORMAL LOW (ref 13.0–18.0)
Lymphocyte #: 2.5 x10 3/mm (ref 1.0–3.6)
Lymphocyte %: 43.8 %
MCH: 31.2 pg (ref 26.0–34.0)
MCHC: 32.7 g/dL (ref 32.0–36.0)
MCV: 95 fL (ref 80–100)
Monocyte #: 0.6 x10 3/mm (ref 0.2–1.0)
Monocyte %: 9.9 %
NEUTROS ABS: 2.7 x10 3/mm (ref 1.4–6.5)
NEUTROS PCT: 45.4 %
PLATELETS: 166 x10 3/mm (ref 150–440)
RBC: 3.95 10*6/uL — AB (ref 4.40–5.90)
RDW: 16.8 % — AB (ref 11.5–14.5)
WBC: 5.8 x10 3/mm (ref 3.8–10.6)

## 2013-11-23 LAB — FERRITIN: Ferritin (ARMC): 109 ng/mL (ref 8–388)

## 2013-11-23 LAB — LACTATE DEHYDROGENASE: LDH: 161 U/L (ref 85–241)

## 2013-11-23 LAB — IRON AND TIBC
IRON SATURATION: 14 %
Iron Bind.Cap.(Total): 360 ug/dL (ref 250–450)
Iron: 50 ug/dL — ABNORMAL LOW (ref 65–175)
Unbound Iron-Bind.Cap.: 310 ug/dL

## 2013-11-24 LAB — PSA: PSA: <0.1 ng/mL (ref 0.0–4.0)

## 2013-12-08 ENCOUNTER — Ambulatory Visit: Payer: Self-pay | Admitting: Hematology and Oncology

## 2013-12-08 DIAGNOSIS — F17210 Nicotine dependence, cigarettes, uncomplicated: Secondary | ICD-10-CM | POA: Diagnosis not present

## 2013-12-08 DIAGNOSIS — Z8719 Personal history of other diseases of the digestive system: Secondary | ICD-10-CM | POA: Diagnosis not present

## 2013-12-08 DIAGNOSIS — Z906 Acquired absence of other parts of urinary tract: Secondary | ICD-10-CM | POA: Diagnosis not present

## 2013-12-08 DIAGNOSIS — Z452 Encounter for adjustment and management of vascular access device: Secondary | ICD-10-CM | POA: Diagnosis not present

## 2013-12-08 DIAGNOSIS — R972 Elevated prostate specific antigen [PSA]: Secondary | ICD-10-CM | POA: Diagnosis not present

## 2013-12-08 DIAGNOSIS — I7090 Unspecified atherosclerosis: Secondary | ICD-10-CM | POA: Diagnosis not present

## 2013-12-08 DIAGNOSIS — I739 Peripheral vascular disease, unspecified: Secondary | ICD-10-CM | POA: Diagnosis not present

## 2013-12-08 DIAGNOSIS — Z79899 Other long term (current) drug therapy: Secondary | ICD-10-CM | POA: Diagnosis not present

## 2013-12-08 DIAGNOSIS — Z9221 Personal history of antineoplastic chemotherapy: Secondary | ICD-10-CM | POA: Diagnosis not present

## 2013-12-08 DIAGNOSIS — Z800 Family history of malignant neoplasm of digestive organs: Secondary | ICD-10-CM | POA: Diagnosis not present

## 2013-12-08 DIAGNOSIS — Z8551 Personal history of malignant neoplasm of bladder: Secondary | ICD-10-CM | POA: Diagnosis not present

## 2013-12-21 DIAGNOSIS — Z8551 Personal history of malignant neoplasm of bladder: Secondary | ICD-10-CM | POA: Diagnosis not present

## 2014-01-08 ENCOUNTER — Ambulatory Visit: Payer: Self-pay | Admitting: Hematology and Oncology

## 2014-01-14 DIAGNOSIS — C674 Malignant neoplasm of posterior wall of bladder: Secondary | ICD-10-CM | POA: Diagnosis not present

## 2014-01-14 DIAGNOSIS — R918 Other nonspecific abnormal finding of lung field: Secondary | ICD-10-CM | POA: Diagnosis not present

## 2014-01-19 DIAGNOSIS — F4312 Post-traumatic stress disorder, chronic: Secondary | ICD-10-CM | POA: Diagnosis not present

## 2014-02-15 ENCOUNTER — Ambulatory Visit: Payer: Self-pay | Admitting: Hematology and Oncology

## 2014-02-15 DIAGNOSIS — F17210 Nicotine dependence, cigarettes, uncomplicated: Secondary | ICD-10-CM | POA: Diagnosis not present

## 2014-02-15 DIAGNOSIS — C679 Malignant neoplasm of bladder, unspecified: Secondary | ICD-10-CM | POA: Diagnosis not present

## 2014-02-15 DIAGNOSIS — I252 Old myocardial infarction: Secondary | ICD-10-CM | POA: Diagnosis not present

## 2014-02-15 DIAGNOSIS — I10 Essential (primary) hypertension: Secondary | ICD-10-CM | POA: Diagnosis not present

## 2014-02-15 DIAGNOSIS — Z800 Family history of malignant neoplasm of digestive organs: Secondary | ICD-10-CM | POA: Diagnosis not present

## 2014-02-15 DIAGNOSIS — Z79899 Other long term (current) drug therapy: Secondary | ICD-10-CM | POA: Diagnosis not present

## 2014-02-15 DIAGNOSIS — M25559 Pain in unspecified hip: Secondary | ICD-10-CM | POA: Diagnosis not present

## 2014-02-15 DIAGNOSIS — Z8719 Personal history of other diseases of the digestive system: Secondary | ICD-10-CM | POA: Diagnosis not present

## 2014-02-15 DIAGNOSIS — E041 Nontoxic single thyroid nodule: Secondary | ICD-10-CM | POA: Diagnosis not present

## 2014-02-15 DIAGNOSIS — M549 Dorsalgia, unspecified: Secondary | ICD-10-CM | POA: Diagnosis not present

## 2014-02-15 DIAGNOSIS — Z936 Other artificial openings of urinary tract status: Secondary | ICD-10-CM | POA: Diagnosis not present

## 2014-02-15 DIAGNOSIS — R911 Solitary pulmonary nodule: Secondary | ICD-10-CM | POA: Diagnosis not present

## 2014-02-15 DIAGNOSIS — M79606 Pain in leg, unspecified: Secondary | ICD-10-CM | POA: Diagnosis not present

## 2014-02-15 DIAGNOSIS — I7090 Unspecified atherosclerosis: Secondary | ICD-10-CM | POA: Diagnosis not present

## 2014-02-15 DIAGNOSIS — I2510 Atherosclerotic heart disease of native coronary artery without angina pectoris: Secondary | ICD-10-CM | POA: Diagnosis not present

## 2014-02-15 DIAGNOSIS — J439 Emphysema, unspecified: Secondary | ICD-10-CM | POA: Diagnosis not present

## 2014-02-15 DIAGNOSIS — Z9221 Personal history of antineoplastic chemotherapy: Secondary | ICD-10-CM | POA: Diagnosis not present

## 2014-02-15 DIAGNOSIS — I878 Other specified disorders of veins: Secondary | ICD-10-CM | POA: Diagnosis not present

## 2014-02-15 DIAGNOSIS — R972 Elevated prostate specific antigen [PSA]: Secondary | ICD-10-CM | POA: Diagnosis not present

## 2014-02-24 DIAGNOSIS — R911 Solitary pulmonary nodule: Secondary | ICD-10-CM | POA: Diagnosis not present

## 2014-02-24 DIAGNOSIS — J432 Centrilobular emphysema: Secondary | ICD-10-CM | POA: Diagnosis not present

## 2014-02-24 DIAGNOSIS — C7911 Secondary malignant neoplasm of bladder: Secondary | ICD-10-CM | POA: Diagnosis not present

## 2014-03-02 DIAGNOSIS — F4312 Post-traumatic stress disorder, chronic: Secondary | ICD-10-CM | POA: Diagnosis not present

## 2014-03-03 DIAGNOSIS — R911 Solitary pulmonary nodule: Secondary | ICD-10-CM | POA: Diagnosis not present

## 2014-03-03 DIAGNOSIS — R040 Epistaxis: Secondary | ICD-10-CM | POA: Diagnosis not present

## 2014-03-03 DIAGNOSIS — Z720 Tobacco use: Secondary | ICD-10-CM | POA: Diagnosis not present

## 2014-03-03 DIAGNOSIS — C679 Malignant neoplasm of bladder, unspecified: Secondary | ICD-10-CM | POA: Diagnosis not present

## 2014-03-03 DIAGNOSIS — E041 Nontoxic single thyroid nodule: Secondary | ICD-10-CM | POA: Diagnosis not present

## 2014-03-03 DIAGNOSIS — Z23 Encounter for immunization: Secondary | ICD-10-CM | POA: Diagnosis not present

## 2014-03-09 ENCOUNTER — Ambulatory Visit
Admit: 2014-03-09 | Disposition: A | Discharge status: Home / Self Care | Payer: Self-pay | Attending: Hematology and Oncology | Admitting: Hematology and Oncology

## 2014-03-09 DIAGNOSIS — I2510 Atherosclerotic heart disease of native coronary artery without angina pectoris: Secondary | ICD-10-CM | POA: Diagnosis not present

## 2014-03-09 DIAGNOSIS — N400 Enlarged prostate without lower urinary tract symptoms: Secondary | ICD-10-CM | POA: Diagnosis not present

## 2014-03-09 DIAGNOSIS — I739 Peripheral vascular disease, unspecified: Secondary | ICD-10-CM | POA: Diagnosis not present

## 2014-03-09 DIAGNOSIS — I252 Old myocardial infarction: Secondary | ICD-10-CM | POA: Diagnosis not present

## 2014-03-09 DIAGNOSIS — R911 Solitary pulmonary nodule: Secondary | ICD-10-CM | POA: Diagnosis not present

## 2014-03-09 DIAGNOSIS — F17210 Nicotine dependence, cigarettes, uncomplicated: Secondary | ICD-10-CM | POA: Diagnosis not present

## 2014-03-09 DIAGNOSIS — I10 Essential (primary) hypertension: Secondary | ICD-10-CM | POA: Diagnosis not present

## 2014-03-09 DIAGNOSIS — C679 Malignant neoplasm of bladder, unspecified: Secondary | ICD-10-CM | POA: Diagnosis not present

## 2014-03-09 DIAGNOSIS — Z9221 Personal history of antineoplastic chemotherapy: Secondary | ICD-10-CM | POA: Diagnosis not present

## 2014-03-09 DIAGNOSIS — Z906 Acquired absence of other parts of urinary tract: Secondary | ICD-10-CM | POA: Diagnosis not present

## 2014-03-09 DIAGNOSIS — Z79899 Other long term (current) drug therapy: Secondary | ICD-10-CM | POA: Diagnosis not present

## 2014-03-09 DIAGNOSIS — I708 Atherosclerosis of other arteries: Secondary | ICD-10-CM | POA: Diagnosis not present

## 2014-03-09 DIAGNOSIS — C73 Malignant neoplasm of thyroid gland: Secondary | ICD-10-CM | POA: Diagnosis not present

## 2014-03-09 DIAGNOSIS — E041 Nontoxic single thyroid nodule: Secondary | ICD-10-CM | POA: Diagnosis not present

## 2014-03-09 DIAGNOSIS — M545 Low back pain: Secondary | ICD-10-CM | POA: Diagnosis not present

## 2014-03-09 DIAGNOSIS — J439 Emphysema, unspecified: Secondary | ICD-10-CM | POA: Diagnosis not present

## 2014-03-22 ENCOUNTER — Ambulatory Visit: Payer: Self-pay | Admitting: Hematology and Oncology

## 2014-03-22 DIAGNOSIS — I739 Peripheral vascular disease, unspecified: Secondary | ICD-10-CM | POA: Diagnosis not present

## 2014-03-22 DIAGNOSIS — Z8551 Personal history of malignant neoplasm of bladder: Secondary | ICD-10-CM | POA: Diagnosis not present

## 2014-03-22 DIAGNOSIS — F17200 Nicotine dependence, unspecified, uncomplicated: Secondary | ICD-10-CM | POA: Diagnosis not present

## 2014-03-22 DIAGNOSIS — E041 Nontoxic single thyroid nodule: Secondary | ICD-10-CM | POA: Diagnosis not present

## 2014-03-22 DIAGNOSIS — M545 Low back pain: Secondary | ICD-10-CM | POA: Diagnosis not present

## 2014-03-22 DIAGNOSIS — Z955 Presence of coronary angioplasty implant and graft: Secondary | ICD-10-CM | POA: Diagnosis not present

## 2014-03-22 DIAGNOSIS — Z9221 Personal history of antineoplastic chemotherapy: Secondary | ICD-10-CM | POA: Diagnosis not present

## 2014-03-22 DIAGNOSIS — I252 Old myocardial infarction: Secondary | ICD-10-CM | POA: Diagnosis not present

## 2014-03-22 DIAGNOSIS — J432 Centrilobular emphysema: Secondary | ICD-10-CM | POA: Diagnosis not present

## 2014-03-22 DIAGNOSIS — Z79899 Other long term (current) drug therapy: Secondary | ICD-10-CM | POA: Diagnosis not present

## 2014-03-22 DIAGNOSIS — I2510 Atherosclerotic heart disease of native coronary artery without angina pectoris: Secondary | ICD-10-CM | POA: Diagnosis not present

## 2014-03-22 DIAGNOSIS — R911 Solitary pulmonary nodule: Secondary | ICD-10-CM | POA: Diagnosis not present

## 2014-03-31 DIAGNOSIS — C679 Malignant neoplasm of bladder, unspecified: Secondary | ICD-10-CM | POA: Diagnosis not present

## 2014-03-31 DIAGNOSIS — C73 Malignant neoplasm of thyroid gland: Secondary | ICD-10-CM | POA: Diagnosis not present

## 2014-04-02 DIAGNOSIS — C73 Malignant neoplasm of thyroid gland: Secondary | ICD-10-CM | POA: Diagnosis not present

## 2014-04-02 DIAGNOSIS — E041 Nontoxic single thyroid nodule: Secondary | ICD-10-CM | POA: Diagnosis not present

## 2014-04-05 DIAGNOSIS — E042 Nontoxic multinodular goiter: Secondary | ICD-10-CM | POA: Insufficient documentation

## 2014-04-05 DIAGNOSIS — Z01818 Encounter for other preprocedural examination: Secondary | ICD-10-CM | POA: Diagnosis not present

## 2014-04-05 DIAGNOSIS — I2510 Atherosclerotic heart disease of native coronary artery without angina pectoris: Secondary | ICD-10-CM | POA: Insufficient documentation

## 2014-04-05 DIAGNOSIS — R490 Dysphonia: Secondary | ICD-10-CM | POA: Diagnosis not present

## 2014-04-05 DIAGNOSIS — F17200 Nicotine dependence, unspecified, uncomplicated: Secondary | ICD-10-CM | POA: Diagnosis not present

## 2014-04-09 ENCOUNTER — Ambulatory Visit
Admit: 2014-04-09 | Disposition: A | Discharge status: Home / Self Care | Payer: Self-pay | Attending: Hematology and Oncology | Admitting: Hematology and Oncology

## 2014-04-09 DIAGNOSIS — Z01818 Encounter for other preprocedural examination: Secondary | ICD-10-CM | POA: Diagnosis not present

## 2014-04-09 DIAGNOSIS — I10 Essential (primary) hypertension: Secondary | ICD-10-CM | POA: Diagnosis not present

## 2014-04-09 DIAGNOSIS — I252 Old myocardial infarction: Secondary | ICD-10-CM | POA: Diagnosis not present

## 2014-04-09 DIAGNOSIS — E042 Nontoxic multinodular goiter: Secondary | ICD-10-CM | POA: Diagnosis not present

## 2014-04-09 DIAGNOSIS — I2510 Atherosclerotic heart disease of native coronary artery without angina pectoris: Secondary | ICD-10-CM | POA: Diagnosis not present

## 2014-04-09 DIAGNOSIS — Z955 Presence of coronary angioplasty implant and graft: Secondary | ICD-10-CM | POA: Diagnosis not present

## 2014-04-09 DIAGNOSIS — F17210 Nicotine dependence, cigarettes, uncomplicated: Secondary | ICD-10-CM | POA: Diagnosis not present

## 2014-04-15 DIAGNOSIS — I739 Peripheral vascular disease, unspecified: Secondary | ICD-10-CM | POA: Diagnosis not present

## 2014-04-15 DIAGNOSIS — E042 Nontoxic multinodular goiter: Secondary | ICD-10-CM | POA: Diagnosis not present

## 2014-04-15 DIAGNOSIS — I10 Essential (primary) hypertension: Secondary | ICD-10-CM | POA: Diagnosis not present

## 2014-04-15 DIAGNOSIS — I2510 Atherosclerotic heart disease of native coronary artery without angina pectoris: Secondary | ICD-10-CM | POA: Diagnosis not present

## 2014-04-23 ENCOUNTER — Other Ambulatory Visit: Payer: Self-pay | Admitting: Hematology and Oncology

## 2014-04-23 DIAGNOSIS — J984 Other disorders of lung: Secondary | ICD-10-CM

## 2014-04-27 DIAGNOSIS — J383 Other diseases of vocal cords: Secondary | ICD-10-CM | POA: Diagnosis not present

## 2014-04-27 DIAGNOSIS — R0982 Postnasal drip: Secondary | ICD-10-CM | POA: Diagnosis not present

## 2014-04-27 DIAGNOSIS — Z79899 Other long term (current) drug therapy: Secondary | ICD-10-CM | POA: Diagnosis not present

## 2014-04-27 DIAGNOSIS — R490 Dysphonia: Secondary | ICD-10-CM | POA: Diagnosis not present

## 2014-04-27 DIAGNOSIS — C679 Malignant neoplasm of bladder, unspecified: Secondary | ICD-10-CM | POA: Diagnosis not present

## 2014-04-27 DIAGNOSIS — C73 Malignant neoplasm of thyroid gland: Secondary | ICD-10-CM | POA: Diagnosis not present

## 2014-04-27 DIAGNOSIS — F17210 Nicotine dependence, cigarettes, uncomplicated: Secondary | ICD-10-CM | POA: Diagnosis not present

## 2014-04-27 LAB — CREATININE, SERUM: Creatine, Serum: 1.29

## 2014-04-27 NOTE — Op Note (Signed)
PATIENT NAME:  Samuel Harris, Samuel Harris MR#:  [id number] DATE OF BIRTH:  [birth year]  DATE OF PROCEDURE:  10/22/2011  PREOPERATIVE DIAGNOSIS: Transitional cell carcinoma of the bladder.  POSTOPERATIVE DIAGNOSIS: Transitional cell carcinoma of the bladder.   OPERATION:  1. TUR of transitional cell carcinoma.  2. Instillation of mitomycin.   SURGEON: Alcus Dad, MD   ANESTHESIA: General.   INDICATIONS: This patient developed transitional cell carcinoma in the past year or so. A recurrence has occurred since his initial resection. High-grade tumor was identified on the initial resection. He has had mitomycin instillations with each resection and at this time surveillance cystoscopy reveals an area of recurrence lateral and superior to the right ureteral orifice. The remainder of the bladder is free of tumor. He is scheduled for resection and mitomycin instillation.   DESCRIPTION OF PROCEDURE: In the dorsal lithotomy position under general anesthesia, the lower abdomen and genital area was prepped and draped for endoscopic work. The #26 resectoscope was introduced and the bladder reinspected. The previously noted lesion superior and laterally to the right ureteral orifice was then resected with the Stern-McCarthy resectoscope. All bleeding points were sought and cauterized. TUR chips were removed for pathologic study. Following removal of all chips and control of all bleeding, a #18 French Foley catheter was placed in the bladder. 40 mg of mitomycin was instilled and is to be left in place for one hour. The patient tolerated the procedure well and returned to the recovery area in satisfactory condition.   ____________________________ Alcus Dad, MD jsh:drc D: 10/22/2011 17:14:28 ET T: 10/23/2011 07:05:29 ET JOB#: [id number] cc: Alcus Dad, MD, <Dictator> Alcus Dad MD ELECTRONICALLY SIGNED 10/25/2011 10:45

## 2014-04-28 DIAGNOSIS — E041 Nontoxic single thyroid nodule: Secondary | ICD-10-CM | POA: Diagnosis not present

## 2014-04-28 DIAGNOSIS — E042 Nontoxic multinodular goiter: Secondary | ICD-10-CM | POA: Diagnosis not present

## 2014-04-28 DIAGNOSIS — C73 Malignant neoplasm of thyroid gland: Secondary | ICD-10-CM | POA: Diagnosis not present

## 2014-04-30 NOTE — Op Note (Signed)
PATIENT NAME:  Samuel Harris, Samuel Harris MR#:  [id number] DATE OF BIRTH:  08-28-1950  DATE OF PROCEDURE:  06/20/2012  PREOPERATIVE DIAGNOSIS:  Bladder cancer.   POSTOPERATIVE DIAGNOSIS:  Bladder cancer.  PROCEDURE PERFORMED: Insertion of right IJ Infuse-a-Port with ultrasound and fluoroscopic guidance.   SURGEON: Katha Cabal, M.D.   FIRST ASSISTANT: Ms. Gillie Manners.   ACCESS:  Right IJ.   FLUOROSCOPY TIME: Approximately 1 minute.   CONTRAST USED: None.   INDICATIONS: The patient is a 64 year old gentleman who was found to have bladder carcinoma and will be requiring chemotherapy. He is, therefore, undergoing insertion of appropriate IV access. The risks and benefits were reviewed. All questions answered. The patient agrees to proceed.   DESCRIPTION OF PROCEDURE: The patient is taken to special procedures and placed in the supine position. After adequate sedation is achieved, the right neck and chest wall are prepped and draped in a sterile fashion. The patient is positioned with his neck slightly extended, rotated to the left. The operative field was covered with Ioban. 1% lidocaine is then infiltrated into the soft tissues approximately 2 fingerbreadths below the clavicle and at the base of the neck. Ultrasound is placed in a sterile sleeve. Jugular vein is identified. It is echolucent and compressible indicating patency. Image is recorded for the permanent record. Under direct ultrasound visualization, a microneedle was inserted into the jugular vein. A microwire was advanced under fluoroscopic guidance. A small incision is created with an 11 blade scalpel. Pocket fashioned with blunt dissection and the micro sheath is inserted.   A linear incision is created 2 fingerbreadths below the clavicle and a small pocket is fashioned with both blunt and sharp dissection. It is tested for appropriate size with the hub.   The catheter itself is then pulled from the pocket incision to the neck  counterincision. J-wire is then advanced through the micro sheath, dilator and peel-away sheath are inserted over the J-wire. Wire and dilator are removed and the catheter is inserted into the central venous system. It is then pulled back under fluoroscopic guidance so that the tip is at the atrial caval junction. Contour of the catheter was smooth. Catheter is transected, the hub is connected and slipped into the subcutaneous pocket. The port is then accessed percutaneously with a Huber needle. It aspirates easily, flushes well. It is checked under fluoroscopy, the catheter tip is in excellent position and the contour of the catheter was smooth and free of kinks. The subcutaneous tissues are then closed with interrupted 3-0 Vicryl and the skin is closed with 4-0 Monocryl subcuticular. Dermabond is applied to both skin incisions. The patient tolerated the procedure well. There were no immediate complications.    ____________________________ Katha Cabal, MD ggs:rw D: 06/20/2012 12:01:39 ET T: 06/20/2012 12:17:49 ET JOB#: [id number]  cc: Katha Cabal, MD, <Dictator> Alcus Dad, MD [facility] Halina Maidens, MD Katha Cabal MD ELECTRONICALLY SIGNED 06/25/2012 16:08

## 2014-04-30 NOTE — Op Note (Signed)
PATIENT NAME:  Samuel Harris, HUTMACHER MR#:  [id number] DATE OF BIRTH:  1950/08/16  DATE OF PROCEDURE:  05/21/2012  PREOPERATIVE DIAGNOSIS:   Recurrent bladder tumor.  POSTOPERATIVE DIAGNOSIS:   Recurrent bladder tumor.  PROCEDURE PERFORMED:  Transurethral resection of bladder tumor, medium-sized, less than 5 cm but with mitomycin C.  The patient had preop ciprofloxacin.   OPERATIVE FINDINGS AT THE TIME OF SURGERY AND SURGERY:  with the patient sterilely prepped and draped, Good relaxation from a general anesthetic, I do a cystoscopy. There is slight middle lobe enlargement. The patient's on tamsulosin. Lateral lobes of the prostate are not too enlarged.  I see a tumor just medial to the previous resection back in October. It is solid -appearing.  So I resected most of this tumor away, being careful not penetrate the bladder wall. There is minimal bleeding and I coagulated it easily. Fragments are evacuated. A small area just adjacent to the larger tumor that looks to be superficial fulgurated. [facility] resectoscope, 26 [place], is utilized for this with constant irrigation suction so there is no overdistention of the bladder. After the fragments are evacuated completely and the bleeding stopped, I put in 40 mg of mitomycin and 40 mL of saline. He tolerates this well. The 46 French Foley catheter is then plugged. There is 30 mL in the balloon. Bimanual exam shows no fixation.  A B and O suppository is placed in the rectum. The patient is sent to recovery in satisfactory condition with his catheter plugged for one hour to allow for maximum minimizing effect.    ____________________________ Janice Coffin. Elnoria Howard, Pickett rdh:ct D: 05/21/2012 09:50:32 ET T: 05/21/2012 11:15:10 ET JOB#: [id number]  cc: Janice Coffin. Elnoria Howard, DO, <Dictator> RICHARD D HART DO ELECTRONICALLY SIGNED 06/05/2012 17:15

## 2014-05-02 NOTE — Op Note (Signed)
PATIENT NAME:  Samuel Harris, Samuel Harris MR#:  [id number] DATE OF BIRTH:  May 10, 1950  DATE OF PROCEDURE:  03/05/2011  PREOPERATIVE DIAGNOSIS: Carcinoma of the bladder, high-grade.   POSTOPERATIVE DIAGNOSIS: Carcinoma of the bladder, high-grade.  OPERATION: Transurethral resection of transitional cell carcinoma with mitomycin instillation.   SURGEON: Alcus Dad, MD   ANESTHESIA: General.   INDICATIONS: This 64 year old man was evaluated for hematuria and found to have transitional cell carcinoma of the bladder. A resection was done, and the patient had a satisfactory recovery. The pathology report revealed a high-grade lesion. Because of the questionable of invasion of the muscle, the patient is scheduled for a second resection at this time, again with instillation of mitomycin.   DESCRIPTION OF PROCEDURE: In the dorsal lithotomy position under general anesthesia, the lower abdomen and genital area was prepped and draped for endoscopic work. The #21 cystoscope was introduced and inspection performed. The previously noted area of resection had some bullous-like formations around the margin suspicious for early recurrence. There were some other areas of papillary formation in the posterior bladder wall.   The resectoscope sheath was introduced and the resection loop. The areas of suspicion were cauterized carefully. The area of papillary formation above the right trigone was resected for pathologic study, a margin thoroughly cauterized. Care was taken not to extend through the muscle of the bladder wall. Following resection, the Foley catheter was placed and mitomycin therapy, 40 mg, delivered. The patient tolerated the procedure well and returned to the recovery area where mitomycin will be left in place for one hour. He tolerated the procedure well.  ____________________________ Alcus Dad, MD jsh:cbb D: 03/05/2011 16:01:58 ET T: 03/05/2011 16:18:23 ET JOB#: [id number] Alcus Dad  MD ELECTRONICALLY SIGNED 03/06/2011 17:00

## 2014-05-02 NOTE — H&P (Signed)
PATIENT NAME:  Samuel Harris, Samuel Harris MR#:  [id number] DATE OF BIRTH:  [birth year]  DATE OF ADMISSION:  03/05/2011  CHIEF COMPLAINT: Bladder tumor.   HISTORY OF PRESENT ILLNESS: This is a 64 year old African American male who underwent surveillance cystoscopy on 01/24/2011 and he was found to have recurrence. Therefore, he is again scheduled for removal of that bladder mass.   ALLERGIES: Penicillin causes a rash.   CURRENT MEDICATIONS:  1. Daily 81 mg aspirin, which he has been advised to discontinue. 2. Simvastatin. 3. Atenolol. 4. Amlodipine. 5. Cyclobenzaprine. 6. Venlafaxine. 7. Nasonex. 8. Hydrocodone.   SOCIAL HISTORY: The patient smokes 1/2 pack of cigarettes daily. He does not use alcohol.   REVIEW OF SYSTEMS: CONSTITUTIONAL: Normal energy level, strength and general feeling of well being. No recent weight change, loss of appetite or fever. EYES: No visual impairment interfering with activities. No eyestrain, pain, or difficulty with near or far vision. No nystagmus, itching or discharge from the eyes. ENT: Hearing has remained unchanged. No dizziness, tinnitus, or pain in the ears. No recent nosebleed, nasal discharge, or difficulty breathing. No hoarseness, difficulty swallowing, swelling, or redness in the neck. Teeth are in good repair. No soreness or bleeding in the gums. ALLERGIES/IMMUNOLOGIC: No environmental allergies. NEUROLOGICAL: No alterations in sensorium or memory. No history of fainting, tremors, or seizures. No headaches of a major nature. ENDOCRINE: No history of thyroid disease, diabetes, or adrenal disorders. INTEGUMENTARY: No chronic skin conditions, rashes, or eruptions. No areas of erythema or itching. HEMATOLOGIC: No history of bruising or free bleeding. No swollen glands or anemia. No recent blood transfusions. PSYCHOLOGIC: No diagnosed psychiatric problems, psychotropic medications, undue insomnia, anxiety, or depression.   PHYSICAL EXAMINATION:  VITAL SIGNS:  Weight 170 pounds, height 5 feet 10-1/4 inches, blood pressure is 140/78.   GENERAL: This is a pleasant African American male in no acute distress. Good development and nutrition. No deformities are noted.   HEENT: Head is normocephalic. Pupils equal, round, and reactive to light and accommodation. Extraocular movements are intact. No lymphadenopathy. Adequate dentition.   HEART: S1, S2 without murmurs, rubs, or gallops.   LUNGS: Clear to auscultation without rales, rhonchi, wheezes, or rubs.   ABDOMEN: Positive bowel sounds, soft, nontender, nondistended. No costovertebral angle tenderness noted bilaterally. Spleen is not enlarged. No splenic masses or splenic tenderness noted. Liver is not enlarged. No liver masses or liver tenderness noted.   GU/RECTAL: Deferred.   SKIN: Dry and smooth without jaundice, rash, or lesions.   LABORATORY DATA: Urinalysis: Color is yellow, clear, pH 6.0, sugar 0, microscopically negative.   IMPRESSION: Bladder carcinoma recurrence.   PLAN: The patient will be scheduled for transurethral resection of the bladder tumor with mitomycin instillation. The procedure, risks, and alternatives were explained to the patient. He voices understanding and wished to proceed.   ____________________________ Nori Riis, PA sam:ap D: 02/05/2011 12:18:00 ET          T: 02/05/2011 12:28:11 ET JOB#: [id number] Christne Platts A Raine Elsass PA ELECTRONICALLY SIGNED 02/06/2011 19:13

## 2014-05-06 DIAGNOSIS — I2510 Atherosclerotic heart disease of native coronary artery without angina pectoris: Secondary | ICD-10-CM | POA: Diagnosis not present

## 2014-05-06 DIAGNOSIS — I10 Essential (primary) hypertension: Secondary | ICD-10-CM | POA: Diagnosis not present

## 2014-05-06 DIAGNOSIS — I739 Peripheral vascular disease, unspecified: Secondary | ICD-10-CM | POA: Diagnosis not present

## 2014-05-21 ENCOUNTER — Other Ambulatory Visit: Payer: Self-pay | Admitting: *Deleted

## 2014-05-21 DIAGNOSIS — C73 Malignant neoplasm of thyroid gland: Secondary | ICD-10-CM

## 2014-05-21 DIAGNOSIS — C799 Secondary malignant neoplasm of unspecified site: Secondary | ICD-10-CM

## 2014-05-21 DIAGNOSIS — N400 Enlarged prostate without lower urinary tract symptoms: Secondary | ICD-10-CM

## 2014-05-21 DIAGNOSIS — C679 Malignant neoplasm of bladder, unspecified: Secondary | ICD-10-CM

## 2014-05-25 ENCOUNTER — Other Ambulatory Visit: Payer: Self-pay

## 2014-05-25 ENCOUNTER — Ambulatory Visit
Admission: RE | Admit: 2014-05-25 | Discharge: 2014-05-25 | Disposition: A | Discharge status: Home / Self Care | Payer: Medicare Other | Source: Ambulatory Visit | Attending: Hematology and Oncology | Admitting: Hematology and Oncology

## 2014-05-25 DIAGNOSIS — J432 Centrilobular emphysema: Secondary | ICD-10-CM | POA: Diagnosis not present

## 2014-05-25 DIAGNOSIS — J984 Other disorders of lung: Secondary | ICD-10-CM

## 2014-05-25 DIAGNOSIS — I2510 Atherosclerotic heart disease of native coronary artery without angina pectoris: Secondary | ICD-10-CM | POA: Insufficient documentation

## 2014-05-25 DIAGNOSIS — C679 Malignant neoplasm of bladder, unspecified: Secondary | ICD-10-CM

## 2014-05-25 DIAGNOSIS — R911 Solitary pulmonary nodule: Secondary | ICD-10-CM | POA: Insufficient documentation

## 2014-05-25 DIAGNOSIS — I700 Atherosclerosis of aorta: Secondary | ICD-10-CM | POA: Diagnosis not present

## 2014-05-26 ENCOUNTER — Inpatient Hospital Stay: Payer: Medicare Other | Attending: Hematology and Oncology | Admitting: Hematology and Oncology

## 2014-05-26 ENCOUNTER — Inpatient Hospital Stay: Payer: Medicare Other | Admitting: Hematology and Oncology

## 2014-05-26 ENCOUNTER — Inpatient Hospital Stay: Payer: Medicare Other

## 2014-05-26 ENCOUNTER — Other Ambulatory Visit: Payer: Self-pay | Admitting: Family Medicine

## 2014-05-26 DIAGNOSIS — F17210 Nicotine dependence, cigarettes, uncomplicated: Secondary | ICD-10-CM | POA: Diagnosis not present

## 2014-05-26 DIAGNOSIS — I2510 Atherosclerotic heart disease of native coronary artery without angina pectoris: Secondary | ICD-10-CM

## 2014-05-26 DIAGNOSIS — Z809 Family history of malignant neoplasm, unspecified: Secondary | ICD-10-CM | POA: Insufficient documentation

## 2014-05-26 DIAGNOSIS — C679 Malignant neoplasm of bladder, unspecified: Secondary | ICD-10-CM | POA: Insufficient documentation

## 2014-05-26 DIAGNOSIS — N400 Enlarged prostate without lower urinary tract symptoms: Secondary | ICD-10-CM

## 2014-05-26 DIAGNOSIS — I10 Essential (primary) hypertension: Secondary | ICD-10-CM | POA: Diagnosis not present

## 2014-05-26 DIAGNOSIS — Z7982 Long term (current) use of aspirin: Secondary | ICD-10-CM | POA: Diagnosis not present

## 2014-05-26 DIAGNOSIS — Z85850 Personal history of malignant neoplasm of thyroid: Secondary | ICD-10-CM | POA: Insufficient documentation

## 2014-05-26 DIAGNOSIS — Z79899 Other long term (current) drug therapy: Secondary | ICD-10-CM | POA: Diagnosis not present

## 2014-05-26 DIAGNOSIS — J439 Emphysema, unspecified: Secondary | ICD-10-CM | POA: Insufficient documentation

## 2014-05-26 DIAGNOSIS — E041 Nontoxic single thyroid nodule: Secondary | ICD-10-CM

## 2014-05-26 DIAGNOSIS — R918 Other nonspecific abnormal finding of lung field: Secondary | ICD-10-CM | POA: Insufficient documentation

## 2014-05-26 DIAGNOSIS — Z9221 Personal history of antineoplastic chemotherapy: Secondary | ICD-10-CM | POA: Diagnosis not present

## 2014-05-26 HISTORY — DX: Nontoxic single thyroid nodule: E04.1

## 2014-05-26 HISTORY — DX: Other nonspecific abnormal finding of lung field: R91.8

## 2014-05-26 LAB — COMPREHENSIVE METABOLIC PANEL
ALT: 12 U/L — ABNORMAL LOW (ref 17–63)
AST: 16 U/L (ref 15–41)
Albumin: 4.3 g/dL (ref 3.5–5.0)
Alkaline Phosphatase: 55 U/L (ref 38–126)
Anion gap: 8 (ref 5–15)
BUN: 16 mg/dL (ref 6–20)
CO2: 27 mmol/L (ref 22–32)
Calcium: 9.1 mg/dL (ref 8.9–10.3)
Chloride: 101 mmol/L (ref 101–111)
Creatinine, Ser: 1.11 mg/dL (ref 0.61–1.24)
GFR calc Af Amer: >60 mL/min (ref >60)
GFR calc non Af Amer: >60 mL/min (ref >60)
Glucose, Bld: 97 mg/dL (ref 65–99)
Potassium: 4.1 mmol/L (ref 3.5–5.1)
Sodium: 136 mmol/L (ref 135–145)
Total Bilirubin: 0.5 mg/dL (ref 0.3–1.2)
Total Protein: 7.3 g/dL (ref 6.5–8.1)

## 2014-05-26 LAB — CBC WITH DIFFERENTIAL/PLATELET
Basophils Absolute: 0 10*3/uL (ref 0–0.1)
Basophils Relative: 0 %
Eosinophils Absolute: 0.1 10*3/uL (ref 0–0.7)
Eosinophils Relative: 1 %
HCT: 36.7 % — ABNORMAL LOW (ref 40.0–52.0)
Hemoglobin: 12.1 g/dL — ABNORMAL LOW (ref 13.0–18.0)
Lymphocytes Relative: 39 %
Lymphs Abs: 2.2 10*3/uL (ref 1.0–3.6)
MCH: 30.7 pg (ref 26.0–34.0)
MCHC: 33.1 g/dL (ref 32.0–36.0)
MCV: 92.8 fL (ref 80.0–100.0)
Monocytes Absolute: 0.7 10*3/uL (ref 0.2–1.0)
Monocytes Relative: 12 %
Neutro Abs: 2.6 10*3/uL (ref 1.4–6.5)
Neutrophils Relative %: 48 %
Platelets: 154 10*3/uL (ref 150–440)
RBC: 3.96 MIL/uL — ABNORMAL LOW (ref 4.40–5.90)
RDW: 15.9 % — ABNORMAL HIGH (ref 11.5–14.5)
WBC: 5.6 10*3/uL (ref 3.8–10.6)

## 2014-05-26 MED ORDER — HEPARIN SOD (PORK) LOCK FLUSH 100 UNIT/ML IV SOLN
500.0000 [IU] | Freq: Once | INTRAVENOUS | Status: AC
  Administered 2014-05-26: 500 [IU] via INTRAVENOUS

## 2014-05-26 MED ORDER — HEPARIN SOD (PORK) LOCK FLUSH 100 UNIT/ML IV SOLN
INTRAVENOUS | Status: AC
Start: 2014-05-26 — End: 2014-05-26
  Filled 2014-05-26: qty 5

## 2014-05-26 MED ORDER — SODIUM CHLORIDE 0.9 % IJ SOLN
10.0000 mL | INTRAMUSCULAR | Status: AC | PRN
  Administered 2014-05-26: 10 mL via INTRAVENOUS
  Filled 2014-05-26: qty 10

## 2014-05-26 NOTE — Progress Notes (Signed)
[place]  [phone]  Fax:([phone]) [place] OB: November 08, 1950  MR#: [id number]  NTI#:[id number]  No care team member to display  CHIEF COMPLAINT:  The patient is a 64 year old gentleman with a history of  stage II versus stage IV bladder cancer who is seen for reassessment and review of interval CT scans. Newly diagnosed Papillary Carcinoma of thyroid, March 2016. HPI:   The patient presented In 02/2011 with hematuria.  Pathology revealed high grade bladder cancer with invasion of the lamina propria and no definite muscularis propria invasion.  He was treated with resection and mitomycin. Despite this, he had at least three recurrences.  He was followed by urology with every 3 month cystoscopies.  CT scan on 05/05/2012 revealed a bladder mass and focal high-grade stenosis proximal right external iliac artery There was partial obstruction of the right ureter.  Patient underwent TURBT on 05/21/2012.  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  He received 6 cycles of cisplatin and gemcitabine from 06/23/2012 until 10/06/2012. He tolerated his treatment well.  He required transfusion support. He underwent cystoprostatectomy with a bladder diversion on 11/17/2012 at [facility].  Post chemotherapy pathology clinic notes revealed "no evidence of residual tumor", but with a later comments of "2 of 2 lymph nodes positive".  Chest, abdomen, and pelvic CT scan on 02/24/2014 revealed a 3 mm LUL pulmonary nodule, centrilobar emphysema, and a 1.7 cm low-density lesion in the left lobe of the thyroid.  He has recently had an US guided biopsy of left thyroid nodule. Pathology report suspicious for malignancy, specifically papillary carcinoma. He is for surgical resection at [facility] on June 8th, 2016 with Dr. Brynda Rim.  INTERVAL HISTORY:  Patient is here for further follow up  and treatment consideration regarding bladder cancer. He is s/p TURBT in 2014. He was most recently evaluated for Thyroid nodule. Pathology revealed suspicion of malignancy, papillary carcinoma. He is scheduled for surgical resection on June 8th, 2016 with Dr. Brynda Rim at [facility] Dr. Brynda Rim was unsure if procedure would be removal of nodule or complete thyroidectomy, decision will be made in OR. He had a CT scan of chest yesterday for reevaluation of multiple subcentimeter lung nodules. Scan was reported as stable with no growth.  REVIEW OF SYSTEMS:   Review of Systems  Genitourinary:       Urostomy in place.  All other systems reviewed and are negative.   As per HPI. Otherwise, a complete review of systems is negatve.  PAST MEDICAL HISTORY: Past Medical History  Diagnosis Date  . Bladder cancer   . Stenosis of iliac artery   . Bowel obstruction   . Back pain     low back  . BPH (benign prostatic hyperplasia)   . CAD (coronary artery disease)   . Myocardial infarction   . Hypertension   . Multiple lung nodules on CT 05/26/2014  . Thyroid nodule 05/26/2014    PAST SURGICAL HISTORY: Past Surgical History  Procedure Laterality Date  . Cystectomy N/A     Total  . Transurethral resection of bladder tumor with gyrus (turbt-gyrus)    . Circumcision    . Nasal sinus surgery    . Coronary angioplasty with stent placement      FAMILY HISTORY Family History  Problem Relation Age of Onset  . Cancer Mother     GYNECOLOGIC HISTORY:  No LMP for male patient.  ADVANCED DIRECTIVES:    HEALTH MAINTENANCE: History  Substance Use Topics  . Smoking status: Current Every Day Smoker -- 1.00 packs/day for 30 years  . Smokeless tobacco: Not on file  . Alcohol Use: Not on file     Colonoscopy:  PAP:  Bone density:  Lipid panel:  Allergies  Allergen Reactions  . Penicillins Hives    Current Outpatient Prescriptions  Medication Sig Dispense Refill  . amLODipine (NORVASC) 5 MG  tablet Take 5 mg by mouth daily.    Marland Kitchen aspirin EC 81 MG tablet Take by mouth.    Marland Kitchen atenolol (TENORMIN) 100 MG tablet Take by mouth.    . fluticasone (FLONASE) 50 MCG/ACT nasal spray Place 1 spray into both nostrils daily.    Marland Kitchen omeprazole (PRILOSEC) 10 MG capsule Take 10 mg by mouth daily.    Marland Kitchen oxyCODONE (OXY IR/ROXICODONE) 5 MG immediate release tablet Take 5 mg by mouth every 4 (four) hours as needed for severe pain.    Marland Kitchen prochlorperazine (COMPAZINE) 10 MG tablet Take 10 mg by mouth every 8 (eight) hours as needed for nausea or vomiting.    . ranitidine (ZANTAC) 150 MG tablet Take by mouth.    . senna-docusate (SENOKOT-S) 8.6-50 MG per tablet Take 2 tablets by mouth at bedtime.    . simvastatin (ZOCOR) 20 MG tablet Take 20 mg by mouth daily.    . simvastatin (ZOCOR) 20 MG tablet Take by mouth.    . varenicline (CHANTIX) 1 MG tablet Take by mouth.    . venlafaxine XR (EFFEXOR-XR) 150 MG 24 hr capsule Take 300 mg by mouth daily with breakfast.     No current facility-administered medications for this visit.   Facility-Administered Medications Ordered in Other Visits  Medication Dose Route Frequency Provider Last Rate Last Dose  . sodium chloride 0.9 % injection 10 mL  10 mL Intravenous PRN Lequita Asal, MD   10 mL at 05/26/14 0930    OBJECTIVE: There were no vitals filed for this visit.   There is no weight on file to calculate BMI.    ECOG FS:0 - Asymptomatic  General: Well-developed, well-nourished, no acute distress. Eyes: Pink conjunctiva, anicteric sclera. HEENT: Normocephalic, moist mucous membranes, small palpable thyroid nodule. Lungs: Clear to auscultation bilaterally. Heart: Regular rate and rhythm. No rubs, murmurs, or gallops. Abdomen: Soft, nontender, nondistended. No organomegaly noted, normoactive bowel sounds. Musculoskeletal: No edema, cyanosis, or clubbing. Neuro: Alert, answering all questions appropriately. Cranial nerves grossly intact. Skin: No rashes or  petechiae noted. Psych: Normal affect.    LAB RESULTS:     Component Value Date/Time   NA 136 05/26/2014 0916   NA 135* 11/23/2013 1054   K 4.1 05/26/2014 0916   K 3.9 11/23/2013 1054   CL 101 05/26/2014 0916   CL 99 11/23/2013 1054   CO2 27 05/26/2014 0916   CO2 27 11/23/2013 1054   GLUCOSE 97 05/26/2014 0916   GLUCOSE 88 11/23/2013 1054   BUN 16 05/26/2014 0916   BUN 19* 11/23/2013 1054   CREATININE 1.11 05/26/2014 0916   CREATININE 1.15 11/23/2013 1054   CALCIUM 9.1 05/26/2014 0916   CALCIUM 9.5 11/23/2013 1054   PROT 7.3 05/26/2014 0916   PROT 7.7 11/23/2013 1054   ALBUMIN 4.3 05/26/2014 0916   ALBUMIN 3.7 11/23/2013 1054   AST 16 05/26/2014 0916   AST 16 11/23/2013 1054   ALT 12* 05/26/2014 0916   ALT 19 11/23/2013 1054   ALKPHOS 55 05/26/2014 0916  ALKPHOS 69 11/23/2013 1054   BILITOT 0.5 05/26/2014 0916   GFRNONAA >60 05/26/2014 0916   GFRNONAA >60 07/20/2013 0920   GFRAA >60 05/26/2014 0916   GFRAA >60 07/20/2013 0920    No results found for: SPEP, UPEP  Lab Results  Component Value Date   WBC 5.6 05/26/2014   NEUTROABS 2.6 05/26/2014   HGB 12.1* 05/26/2014   HCT 36.7* 05/26/2014   MCV 92.8 05/26/2014   PLT 154 05/26/2014      Chemistry      Component Value Date/Time   NA 136 05/26/2014 0916   NA 135* 11/23/2013 1054   K 4.1 05/26/2014 0916   K 3.9 11/23/2013 1054   CL 101 05/26/2014 0916   CL 99 11/23/2013 1054   CO2 27 05/26/2014 0916   CO2 27 11/23/2013 1054   BUN 16 05/26/2014 0916   BUN 19* 11/23/2013 1054   CREATININE 1.11 05/26/2014 0916   CREATININE 1.15 11/23/2013 1054      Component Value Date/Time   CALCIUM 9.1 05/26/2014 0916   CALCIUM 9.5 11/23/2013 1054   ALKPHOS 55 05/26/2014 0916   ALKPHOS 69 11/23/2013 1054   AST 16 05/26/2014 0916   AST 16 11/23/2013 1054   ALT 12* 05/26/2014 0916   ALT 19 11/23/2013 1054   BILITOT 0.5 05/26/2014 0916       No results found for: LABCA2  No components found for:  [id number]  No results for input(s): INR in the last 168 hours.  No results found for: COLORURINE, APPEARANCEUR, LABSPEC, PHURINE, GLUCOSEU, HGBUR, BILIRUBINUR, KETONESUR, PROTEINUR, UROBILINOGEN, NITRITE, LEUKOCYTESUR  STUDIES: Ct Chest Wo Contrast  05/25/2014   CLINICAL DATA:  Pulmonary nodule.  History of bladder cancer.  EXAM: CT CHEST WITHOUT CONTRAST  TECHNIQUE: Multidetector CT imaging of the chest was performed following the standard protocol without IV contrast.  COMPARISON:  02/24/2014  FINDINGS: Mediastinum/Nodes: Low-density left thyroid nodule, recently worked up with thyroid ultrasound and fine needle aspiration.  Right Port-A-Cath tip:  Cavoatrial junction.  Coronary, aortic arch, and branch vessel atherosclerosis.  No pathologic thoracic adenopathy.  Lungs/Pleura: Mild biapical pleural parenchymal scarring. Centrilobular emphysema. 3 by 4 mm right middle lobe pulmonary nodule, image 50 series 3, stable from 05/05/2012. 4 by 3 mm left upper lobe pulmonary nodule, image 17 series 3, stable from 02/24/2014.  Upper abdomen: Unremarkable  Musculoskeletal: Unremarkable  IMPRESSION: 1. 3 by 4 mm pulmonary nodule posteriorly in the left upper lobe, stable from 02/24/2014. This documents 3 months of stability. Typical Fleischner criteria for follow up do not apply due to the history of bladder cancer. Consider followup chest CT in 6-12 months time. 2. The similarly sized right middle lobe nodule adjacent to the major fissure is stable from 2014 and considered benign. 3. Centrilobular emphysema. 4. Atherosclerosis.   Electronically Signed   By: Van Clines M.D.   On: 05/25/2014 12:25    ASSESSMENT:  Bladder Cancer. Lung Nodules on CT. Thyroid Nodule.  PLAN:   1. Bladder ca. 64 year old gentleman with stage II versus stage IV bladder cancer.  He presented In 02/2011 with hematuria.  Pathology revealed high grade bladder cancer with invasion of the lamina propria and no muscle invasion.  He  was treated with resection and mitomycin. Despite this, he had at least three recurrences. Chest, abdomen, and pelvic CT scan on 02/24/2014 revealed a 3 mm LUL pulmonary nodule, centrilobar emphysema, and a 1.7 cm low-density lesion in the left lobe of the thyroid. He has  a history of elevated PSA and a negative biopsy in 11/2009.  PSA was 7.6 in 2012, 7.9 on 12/21/2013, and < 0.1 on 01/23/2013. S/P TURBT.  2. Lung nodules. CT scan from 05/25/2014 showed stable sized nodules. Will continue with reevaluation in 6 months as recommended by [facility].  3. Thyroid nodule. Patient has resection scheduled with Dr. Brynda Rim at [facility] on June 8th, 2016 with a post operative follow up on June 23rd. Discussed with patient and wife that we will see him back in approximately 8 weeks for reevaluation and to discuss surgical procedure and pathology reports.  Patient expressed understanding and was in agreement with this plan. He also understands that He can call clinic at any time with any questions, concerns, or complaints.   Dr. Oliva Bustard was available for consultation and review of plan of care for this patient. No matching staging information was found for the patient.  Evlyn Kanner, NP   05/26/2014 10:28 AM    Wt Readings from Last 3 Encounters:  05/26/14 152 lb 8.9 oz (69.2 kg)  03/31/14 155 lb 6.8 oz (70.5 kg)   Temp Readings from Last 3 Encounters:  05/26/14 96 F (35.6 C) Tympanic  03/31/14 96.6 F (35.9 C)    BP Readings from Last 3 Encounters:  05/26/14 149/69  03/31/14 158/80   Pulse Readings from Last 3 Encounters:  05/26/14 58  03/31/14 76

## 2014-05-26 NOTE — Progress Notes (Signed)
Pt here today for follow up regarding bladder cancer; offers  No complaints today

## 2014-05-27 LAB — PSA: PSA: 0.01 ng/mL (ref 0.00–4.00)

## 2014-06-04 DIAGNOSIS — I10 Essential (primary) hypertension: Secondary | ICD-10-CM | POA: Diagnosis not present

## 2014-06-04 DIAGNOSIS — D638 Anemia in other chronic diseases classified elsewhere: Secondary | ICD-10-CM | POA: Diagnosis not present

## 2014-06-04 DIAGNOSIS — F17200 Nicotine dependence, unspecified, uncomplicated: Secondary | ICD-10-CM | POA: Diagnosis not present

## 2014-06-04 DIAGNOSIS — I2510 Atherosclerotic heart disease of native coronary artery without angina pectoris: Secondary | ICD-10-CM | POA: Diagnosis not present

## 2014-06-04 DIAGNOSIS — K219 Gastro-esophageal reflux disease without esophagitis: Secondary | ICD-10-CM | POA: Diagnosis not present

## 2014-06-04 DIAGNOSIS — I739 Peripheral vascular disease, unspecified: Secondary | ICD-10-CM | POA: Diagnosis not present

## 2014-06-04 DIAGNOSIS — E042 Nontoxic multinodular goiter: Secondary | ICD-10-CM | POA: Diagnosis not present

## 2014-06-04 DIAGNOSIS — C61 Malignant neoplasm of prostate: Secondary | ICD-10-CM | POA: Diagnosis not present

## 2014-06-04 DIAGNOSIS — C674 Malignant neoplasm of posterior wall of bladder: Secondary | ICD-10-CM | POA: Diagnosis not present

## 2014-06-04 DIAGNOSIS — R490 Dysphonia: Secondary | ICD-10-CM | POA: Diagnosis not present

## 2014-06-08 DIAGNOSIS — F4312 Post-traumatic stress disorder, chronic: Secondary | ICD-10-CM | POA: Diagnosis not present

## 2014-06-09 HISTORY — PX: TOTAL THYROIDECTOMY: SHX2547

## 2014-06-10 DIAGNOSIS — K219 Gastro-esophageal reflux disease without esophagitis: Secondary | ICD-10-CM | POA: Diagnosis not present

## 2014-06-10 DIAGNOSIS — I2510 Atherosclerotic heart disease of native coronary artery without angina pectoris: Secondary | ICD-10-CM | POA: Diagnosis not present

## 2014-06-10 DIAGNOSIS — Z01810 Encounter for preprocedural cardiovascular examination: Secondary | ICD-10-CM | POA: Diagnosis not present

## 2014-06-10 DIAGNOSIS — I252 Old myocardial infarction: Secondary | ICD-10-CM | POA: Diagnosis not present

## 2014-06-10 DIAGNOSIS — D649 Anemia, unspecified: Secondary | ICD-10-CM | POA: Diagnosis not present

## 2014-06-16 DIAGNOSIS — E042 Nontoxic multinodular goiter: Secondary | ICD-10-CM | POA: Diagnosis not present

## 2014-06-16 DIAGNOSIS — R590 Localized enlarged lymph nodes: Secondary | ICD-10-CM | POA: Diagnosis not present

## 2014-06-17 DIAGNOSIS — I10 Essential (primary) hypertension: Secondary | ICD-10-CM | POA: Diagnosis not present

## 2014-06-17 DIAGNOSIS — R001 Bradycardia, unspecified: Secondary | ICD-10-CM | POA: Diagnosis not present

## 2014-06-18 ENCOUNTER — Encounter: Payer: Self-pay | Admitting: Internal Medicine

## 2014-06-18 DIAGNOSIS — F4310 Post-traumatic stress disorder, unspecified: Secondary | ICD-10-CM | POA: Insufficient documentation

## 2014-06-21 ENCOUNTER — Other Ambulatory Visit: Payer: Self-pay | Admitting: Internal Medicine

## 2014-06-23 ENCOUNTER — Encounter: Payer: Self-pay | Admitting: Internal Medicine

## 2014-06-23 ENCOUNTER — Ambulatory Visit (INDEPENDENT_AMBULATORY_CARE_PROVIDER_SITE_OTHER): Payer: Medicare Other | Admitting: Internal Medicine

## 2014-06-23 ENCOUNTER — Inpatient Hospital Stay: Payer: Medicare Other | Attending: Hematology and Oncology

## 2014-06-23 ENCOUNTER — Other Ambulatory Visit: Payer: Self-pay | Admitting: Internal Medicine

## 2014-06-23 VITALS — BP 102/78 | HR 80 | Ht 72.0 in | Wt 145.6 lb

## 2014-06-23 DIAGNOSIS — C679 Malignant neoplasm of bladder, unspecified: Secondary | ICD-10-CM | POA: Diagnosis present

## 2014-06-23 DIAGNOSIS — I10 Essential (primary) hypertension: Secondary | ICD-10-CM | POA: Diagnosis not present

## 2014-06-23 DIAGNOSIS — Z452 Encounter for adjustment and management of vascular access device: Secondary | ICD-10-CM | POA: Diagnosis not present

## 2014-06-23 DIAGNOSIS — R001 Bradycardia, unspecified: Secondary | ICD-10-CM | POA: Diagnosis not present

## 2014-06-23 DIAGNOSIS — Z720 Tobacco use: Secondary | ICD-10-CM | POA: Diagnosis not present

## 2014-06-23 DIAGNOSIS — Z95828 Presence of other vascular implants and grafts: Secondary | ICD-10-CM

## 2014-06-23 DIAGNOSIS — C73 Malignant neoplasm of thyroid gland: Secondary | ICD-10-CM | POA: Diagnosis not present

## 2014-06-23 DIAGNOSIS — F17200 Nicotine dependence, unspecified, uncomplicated: Secondary | ICD-10-CM

## 2014-06-23 DIAGNOSIS — M62838 Other muscle spasm: Secondary | ICD-10-CM

## 2014-06-23 MED ORDER — ATENOLOL 50 MG PO TABS: 50.0000 mg | ORAL_TABLET | Freq: Every day | ORAL | Status: DC

## 2014-06-23 MED ORDER — LEVOTHYROXINE SODIUM 112 MCG PO TABS: 112.0000 ug | ORAL_TABLET | Freq: Every day | ORAL | Status: DC

## 2014-06-23 MED ORDER — HYDROCODONE-ACETAMINOPHEN 5-325 MG PO TABS: 1.0000 | ORAL_TABLET | Freq: Two times a day (BID) | ORAL | Status: DC | PRN

## 2014-06-23 MED ORDER — HEPARIN SOD (PORK) LOCK FLUSH 100 UNIT/ML IV SOLN
500.0000 [IU] | Freq: Once | INTRAVENOUS | Status: AC
  Administered 2014-06-23: 500 [IU] via INTRAVENOUS
  Filled 2014-06-23: qty 5

## 2014-06-23 MED ORDER — SODIUM CHLORIDE 0.9 % IJ SOLN
10.0000 mL | INTRAMUSCULAR | Status: DC | PRN
  Administered 2014-06-23: 10 mL via INTRAVENOUS
  Filled 2014-06-23: qty 10

## 2014-06-23 NOTE — Patient Instructions (Signed)
Take cyclobenzaprine 10 mg at bedtime for muscle spasm Use ice 20 minutes three times a day to right shoulder spasm
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
## 2014-07-02 DIAGNOSIS — Z8719 Personal history of other diseases of the digestive system: Secondary | ICD-10-CM | POA: Insufficient documentation

## 2014-07-08 DIAGNOSIS — Z9009 Acquired absence of other part of head and neck: Secondary | ICD-10-CM | POA: Insufficient documentation

## 2014-07-08 DIAGNOSIS — E890 Postprocedural hypothyroidism: Secondary | ICD-10-CM | POA: Insufficient documentation

## 2014-07-21 ENCOUNTER — Ambulatory Visit: Payer: Medicare Other | Admitting: Hematology and Oncology

## 2014-07-23 ENCOUNTER — Other Ambulatory Visit: Payer: Self-pay

## 2014-07-23 ENCOUNTER — Other Ambulatory Visit: Payer: Self-pay | Admitting: Internal Medicine

## 2014-07-23 DIAGNOSIS — I10 Essential (primary) hypertension: Secondary | ICD-10-CM

## 2014-07-23 DIAGNOSIS — C73 Malignant neoplasm of thyroid gland: Secondary | ICD-10-CM

## 2014-07-23 MED ORDER — LEVOTHYROXINE SODIUM 112 MCG PO TABS: 112.0000 ug | ORAL_TABLET | Freq: Every day | ORAL | Status: DC

## 2014-07-23 MED ORDER — VENLAFAXINE HCL ER 150 MG PO CP24: 300.0000 mg | ORAL_CAPSULE | Freq: Every day | ORAL | Status: DC

## 2014-07-23 MED ORDER — SIMVASTATIN 20 MG PO TABS: 20.0000 mg | ORAL_TABLET | Freq: Every day | ORAL | Status: DC

## 2014-07-23 MED ORDER — ATENOLOL 50 MG PO TABS: 50.0000 mg | ORAL_TABLET | Freq: Every day | ORAL | Status: DC

## 2014-07-23 MED ORDER — FLUTICASONE PROPIONATE 50 MCG/ACT NA SUSP: 2.0000 | Freq: Every day | NASAL | Status: DC

## 2014-07-23 MED ORDER — AMLODIPINE BESYLATE 5 MG PO TABS: 5.0000 mg | ORAL_TABLET | Freq: Every day | ORAL | Status: DC

## 2014-07-23 MED ORDER — RANITIDINE HCL 150 MG PO TABS: 150.0000 mg | ORAL_TABLET | Freq: Every day | ORAL | Status: DC

## 2014-07-28 ENCOUNTER — Inpatient Hospital Stay: Payer: Medicare Other | Attending: Hematology and Oncology | Admitting: Hematology and Oncology

## 2014-07-28 ENCOUNTER — Inpatient Hospital Stay: Payer: Medicare Other

## 2014-07-28 VITALS — BP 126/60 | HR 69 | Temp 95.6°F | Resp 18 | Ht 72.0 in | Wt 144.7 lb

## 2014-07-28 DIAGNOSIS — J432 Centrilobular emphysema: Secondary | ICD-10-CM | POA: Diagnosis not present

## 2014-07-28 DIAGNOSIS — I7090 Unspecified atherosclerosis: Secondary | ICD-10-CM | POA: Diagnosis not present

## 2014-07-28 DIAGNOSIS — E890 Postprocedural hypothyroidism: Secondary | ICD-10-CM | POA: Insufficient documentation

## 2014-07-28 DIAGNOSIS — Z95828 Presence of other vascular implants and grafts: Secondary | ICD-10-CM

## 2014-07-28 DIAGNOSIS — C679 Malignant neoplasm of bladder, unspecified: Secondary | ICD-10-CM

## 2014-07-28 DIAGNOSIS — F17210 Nicotine dependence, cigarettes, uncomplicated: Secondary | ICD-10-CM | POA: Diagnosis not present

## 2014-07-28 DIAGNOSIS — Z8719 Personal history of other diseases of the digestive system: Secondary | ICD-10-CM | POA: Insufficient documentation

## 2014-07-28 DIAGNOSIS — Z452 Encounter for adjustment and management of vascular access device: Secondary | ICD-10-CM | POA: Insufficient documentation

## 2014-07-28 DIAGNOSIS — I252 Old myocardial infarction: Secondary | ICD-10-CM | POA: Insufficient documentation

## 2014-07-28 DIAGNOSIS — Z8551 Personal history of malignant neoplasm of bladder: Secondary | ICD-10-CM | POA: Diagnosis not present

## 2014-07-28 DIAGNOSIS — R918 Other nonspecific abnormal finding of lung field: Secondary | ICD-10-CM | POA: Diagnosis not present

## 2014-07-28 DIAGNOSIS — I10 Essential (primary) hypertension: Secondary | ICD-10-CM | POA: Diagnosis not present

## 2014-07-28 DIAGNOSIS — N400 Enlarged prostate without lower urinary tract symptoms: Secondary | ICD-10-CM | POA: Diagnosis not present

## 2014-07-28 DIAGNOSIS — Z906 Acquired absence of other parts of urinary tract: Secondary | ICD-10-CM | POA: Diagnosis not present

## 2014-07-28 DIAGNOSIS — I739 Peripheral vascular disease, unspecified: Secondary | ICD-10-CM | POA: Diagnosis not present

## 2014-07-28 DIAGNOSIS — Z800 Family history of malignant neoplasm of digestive organs: Secondary | ICD-10-CM | POA: Diagnosis not present

## 2014-07-28 DIAGNOSIS — Z808 Family history of malignant neoplasm of other organs or systems: Secondary | ICD-10-CM | POA: Diagnosis not present

## 2014-07-28 DIAGNOSIS — Z7982 Long term (current) use of aspirin: Secondary | ICD-10-CM | POA: Insufficient documentation

## 2014-07-28 DIAGNOSIS — M545 Low back pain: Secondary | ICD-10-CM | POA: Diagnosis not present

## 2014-07-28 DIAGNOSIS — Z79899 Other long term (current) drug therapy: Secondary | ICD-10-CM | POA: Diagnosis not present

## 2014-07-28 DIAGNOSIS — I2510 Atherosclerotic heart disease of native coronary artery without angina pectoris: Secondary | ICD-10-CM | POA: Insufficient documentation

## 2014-07-28 MED ORDER — HEPARIN SOD (PORK) LOCK FLUSH 100 UNIT/ML IV SOLN
INTRAVENOUS | Status: AC
  Filled 2014-07-28: qty 5

## 2014-07-28 MED ORDER — HEPARIN SOD (PORK) LOCK FLUSH 100 UNIT/ML IV SOLN
500.0000 [IU] | Freq: Once | INTRAVENOUS | Status: AC
Start: 2014-07-28 — End: 2014-07-28
  Administered 2014-07-28: 500 [IU] via INTRAVENOUS

## 2014-07-28 MED ORDER — SODIUM CHLORIDE 0.9 % IJ SOLN
10.0000 mL | INTRAMUSCULAR | Status: DC | PRN
  Administered 2014-07-28: 10 mL via INTRAVENOUS
  Filled 2014-07-28: qty 10

## 2014-07-28 NOTE — Progress Notes (Signed)
[facility] day:  07/28/2014  Chief Complaint: Samuel Harris is a 64 y.o. 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  HPI: The patient was last seen in the medical oncology clinic on 05/26/2014 by Georgeanne Nim, NP.  At that time, he was being evaluated for a thyroid nodule.  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  He was scheduled for surgical resection on 06/16/2014.  He states he underwent surgery at [facility] by Dr. Brynda Rim.  Surgery went "great".  Per the patient's report, he had no cancer. He has been on levothyroxine  since total thyroidectomy.  He follows up with endocrinology in 08/05/2014.   He had a chest CT on 05/25/2014 which revealed a 3-4 mm pulmonary nodule posteriorly in the left upper lobe, stable from 02/24/2014. Followup chest CT was recommended in 6-12 months.  The similarly sized right middle lobe nodule adjacent to the major fissure was stable from 2014 and considered benign.  There was centrilobular emphysema and atherosclerosis.  Symptomatically, he states that he is doing well. He is waiting for stents in his legs for his peripheral vascular disease. He quit smoking the day before yesterday. He notes that his legs hurt if he is out in the yard.  Past Medical History  Diagnosis Date  . Bladder cancer ([place])   . 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 Bowel obstruction ([place])   . Back pain     low back  . BPH (benign prostatic hyperplasia)   . CAD (coronary artery disease)   . Myocardial infarction ([place])   . Hypertension   . Multiple lung nodules on CT 05/26/2014  . Thyroid nodule 05/26/2014    Past Surgical History  Procedure Laterality Date  . Cystectomy N/A     Total  . Transurethral resection of bladder tumor with gyrus (turbt-gyrus)    . Circumcision    . Nasal sinus surgery    . Coronary angioplasty with stent  placement    . Total thyroidectomy  06/2014    thyroid cancer    Family History  Problem Relation Age of Onset  . Cancer Mother     Pancreatic  . Cancer Sister     Brain tumor  . Cancer Brother     Pancreatic    Social History:  reports that he has been smoking.  He quit smokeless tobacco use about 4 months ago. He reports that he does not drink alcohol or use illicit drugs.  He stopped smoking on 07/26/2014.  The patient is alone today.  Allergies:  Allergies  Allergen Reactions  . Penicillins Hives    Current Medications: Current Outpatient Prescriptions  Medication Sig Dispense Refill  . amLODipine (NORVASC) 5 MG tablet Take 1 tablet (5 mg total) by mouth daily. 90 tablet 3  . aspirin EC 81 MG tablet Take by mouth.    Marland Kitchen atenolol (TENORMIN) 50 MG tablet Take 1 tablet (50 mg total) by mouth daily. 90 tablet 3  . fluticasone (FLONASE) 50 MCG/ACT nasal spray INHALE 2 SPRAYS IN EACH NOSTRIL EVERY DAY 16 g 5  . HYDROcodone-acetaminophen (NORCO/VICODIN) 5-325 MG per tablet Take 1 tablet by mouth 2 (two) times daily as needed for moderate pain. 60 tablet 0  . levothyroxine (SYNTHROID, LEVOTHROID) 112 MCG tablet Take 1 tablet (112 mcg total) by mouth daily. 90 tablet 3  . oxyCODONE (OXY IR/ROXICODONE) 5  MG immediate release tablet Take 5 mg by mouth every 4 (four) hours as needed for severe pain.    Marland Kitchen prochlorperazine (COMPAZINE) 10 MG tablet Take 10 mg by mouth every 8 (eight) hours as needed for nausea or vomiting.    . ranitidine (ZANTAC) 150 MG tablet TAKE 1 TABLET BY MOUTH TWICE A DAY 60 tablet 12  . senna-docusate (SENOKOT-S) 8.6-50 MG per tablet Take 2 tablets by mouth at bedtime.    . simvastatin (ZOCOR) 20 MG tablet TAKE 1 TABLET BY MOUTH AT BEDTIME 90 tablet 3  . venlafaxine XR (EFFEXOR-XR) 150 MG 24 hr capsule Take 2 capsules (300 mg total) by mouth daily with breakfast. 180 capsule 3   No current facility-administered medications for this visit.   Facility-Administered  Medications Ordered in Other Visits  Medication Dose Route Frequency Provider Last Rate Last Dose  . sodium chloride 0.9 % injection 10 mL  10 mL Intravenous PRN Lequita Asal, MD   10 mL at 05/26/14 0930    Review of Systems:  GENERAL:  Doing well.  No fevers, sweats or weight loss. PERFORMANCE STATUS (ECOG):  1 HEENT:  No visual changes, runny nose, sore throat, mouth sores or tenderness. Lungs: No shortness of breath or cough.  No hemoptysis. Cardiac:  No chest pain, palpitations, orthopnea, or PND. GI:  No nausea, vomiting, diarrhea, constipation, melena or hematochezia. GU:  No urgency, frequency, dysuria, or hematuria. Musculoskeletal:  No back pain.  No joint pain.  No muscle tenderness. Extremities:  Legs hurt if out in the yard.  Noswelling. Skin:  No rashes or skin changes. Neuro:  No headache, numbness or weakness, balance or coordination issues. Endocrine:  No diabetes, thyroid issues, hot flashes or night sweats. Psych:  No mood changes, depression or anxiety. Pain:  No focal pain. Review of systems:  All other systems reviewed and found to be negative.   Physical Exam: There were no vitals taken for this visit. GENERAL:  Thin gentleman sitting comfortably in the exam room in no acute distress. MENTAL STATUS:  Alert and oriented to person, place and time. HEAD:  Pearline Cables hair and beard.  Normocephalic, atraumatic, face symmetric, no Cushingoid features. EYES:  Glasses.  Brown eyes.  Pupils equal round and reactive to light and accomodation.  No conjunctivitis or scleral icterus. ENT:  Oropharynx clear without lesion.  Tongue normal. Mucous membranes moist.  NECK:  Well healed incision s/p thyroidectomy. RESPIRATORY:  Clear to auscultation without rales, wheezes or rhonchi. CARDIOVASCULAR:  Regular rate and rhythm without murmur, rub or gallop. ABDOMEN:  Ostomy.  Soft, non-tender, with active bowel sounds, and no hepatosplenomegaly.  No masses. SKIN:  No rashes or  ulcers. EXTREMITIES: No edema, no skin discoloration or tenderness.  No palpable cords. LYMPH NODES: No palpable cervical, supraclavicular, axillary or inguinal adenopathy  NEUROLOGICAL: Unremarkable. PSYCH:  Appropriate.   No visits with results within 3 Day(s) from this visit. Latest known visit with results is:  Infusion on 05/26/2014  Component Date Value Ref Range Status  . Sodium 05/26/2014 136  135 - 145 mmol/L Final  . Potassium 05/26/2014 4.1  3.5 - 5.1 mmol/L Final  . Chloride 05/26/2014 101  101 - 111 mmol/L Final  . CO2 05/26/2014 27  22 - 32 mmol/L Final  . Glucose, Bld 05/26/2014 97  65 - 99 mg/dL Final  . BUN 05/26/2014 16  6 - 20 mg/dL Final  . Creatinine, Ser 05/26/2014 1.11  0.61 - 1.24 mg/dL Final  .  Calcium 05/26/2014 9.1  8.9 - 10.3 mg/dL Final  . Total Protein 05/26/2014 7.3  6.5 - 8.1 g/dL Final  . Albumin 05/26/2014 4.3  3.5 - 5.0 g/dL Final  . AST 05/26/2014 16  15 - 41 U/L Final  . ALT 05/26/2014 12* 17 - 63 U/L Final  . Alkaline Phosphatase 05/26/2014 55  38 - 126 U/L Final  . Total Bilirubin 05/26/2014 0.5  0.3 - 1.2 mg/dL Final  . GFR calc non Af Amer 05/26/2014 >60  >60 mL/min Final  . GFR calc Af Amer 05/26/2014 >60  >60 mL/min Final   Comment: (NOTE) The eGFR has been calculated using the CKD EPI equation. This calculation has not been validated in all clinical situations. eGFR's persistently <60 mL/min signify possible Chronic Kidney Disease.   . Anion gap 05/26/2014 8  5 - 15 Final  . WBC 05/26/2014 5.6  3.8 - 10.6 K/uL Final   A-LINE DRAW  . RBC 05/26/2014 3.96* 4.40 - 5.90 MIL/uL Final  . Hemoglobin 05/26/2014 12.1* 13.0 - 18.0 g/dL Final  . HCT 05/26/2014 36.7* 40.0 - 52.0 % Final  . MCV 05/26/2014 92.8  80.0 - 100.0 fL Final  . MCH 05/26/2014 30.7  26.0 - 34.0 pg Final  . MCHC 05/26/2014 33.1  32.0 - 36.0 g/dL Final  . RDW 05/26/2014 15.9* 11.5 - 14.5 % Final  . Platelets 05/26/2014 154  150 - 440 K/uL Final  . Neutrophils Relative %  05/26/2014 48   Final  . Neutro Abs 05/26/2014 2.6  1.4 - 6.5 K/uL Final  . Lymphocytes Relative 05/26/2014 39   Final  . Lymphs Abs 05/26/2014 2.2  1.0 - 3.6 K/uL Final  . Monocytes Relative 05/26/2014 12   Final  . Monocytes Absolute 05/26/2014 0.7  0.2 - 1.0 K/uL Final  . Eosinophils Relative 05/26/2014 1   Final  . Eosinophils Absolute 05/26/2014 0.1  0 - 0.7 K/uL Final  . Basophils Relative 05/26/2014 0   Final  . Basophils Absolute 05/26/2014 0.0  0 - 0.1 K/uL Final  . PSA 05/26/2014 0.01  0.00 - 4.00 ng/mL Final   Comment: (NOTE) While PSA levels of <=4.0 ng/ml are reported as reference range, some men with levels below 4.0 ng/ml can have prostate cancer and many men with PSA above 4.0 ng/ml do not have prostate cancer.  Other tests such as free PSA, age specific reference ranges, PSA velocity and PSA doubling time may be helpful especially in men less than 62 years old. Performed at [facility]     Assessment:  Samuel Harris is a 63 y.o. 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  He presented In 02/2011 with hematuria.  Pathology revealed high grade bladder cancer with invasion of the lamina propria and no muscle invasion.  He was treated with resection and mitomycin. Despite this, he had at least three recurrences.    CT scan on 05/05/2012 revealed a bladder mass and focal high-grade stenosis proximal right external iliac artery.  There was partial obstruction of the right ureter.  Patient underwent TURBT on 05/21/2012.  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  He received 6 cycles of neoadjuvant cisplatin and gemcitabine (06/23/2012 - 10/06/2012). He underwent cystoprostatectomy with a bladder diversion at [facility] on 11/17/2012.  Notes revealed "no evidence of residual tumor" as well as later comments of "2 of 2 lymph nodes positive" (official pathology report  unavailable).  Chest, abdomen, and pelvic CT scan on 02/24/2014 revealed a 3 mm LUL pulmonary nodule, centrilobar emphysema, and a 1.7 cm low-density lesion in the left lobe of the thyroid.  Cest CT on 05/25/2014 revealed a 3-4 mm pulmonary nodule posteriorly in the left upper lobe (stable).  He has a history of elevated PSA and a negative biopsy in 11/2009.  PSA was 7.6 in 2012, 7.9 on 12/21/2013, and < 0.1 on 01/23/2013.  He underwent thyroidectomy on 06/16/2014 for a suspicious nodule.  Pathology was benign per the patient.  He is on levothyroxine and followed by endocrinology.  Symptomatically, he has leg pain secondary to peripheral vascular disease.  Plan: 1. Review interim labs and events. 2. Port flush today and every 6-8 weeks. 3. Obtain pathology from bladder surgery 11/17/2012 and thyroid surgery 06/16/2014. 4. Chest CT on 11/19/2014. 5. RTC on 11/16 for MD assess, labs (CBC with diff, CMP, CEA), and review of chest CT   Lequita Asal, MD  07/28/2014, 5:56 PM

## 2014-09-03 IMAGING — CR DG CHEST 2V
1 series · 2 of 2 positions shown · non-contrast
Comparison: none

REASON FOR EXAM: gross hematuria and bladder cancer
COMMENTS:

[Series 1: w chest pa · 0.14mm/px · 2 of 2 slices shown]
[im 1/2]
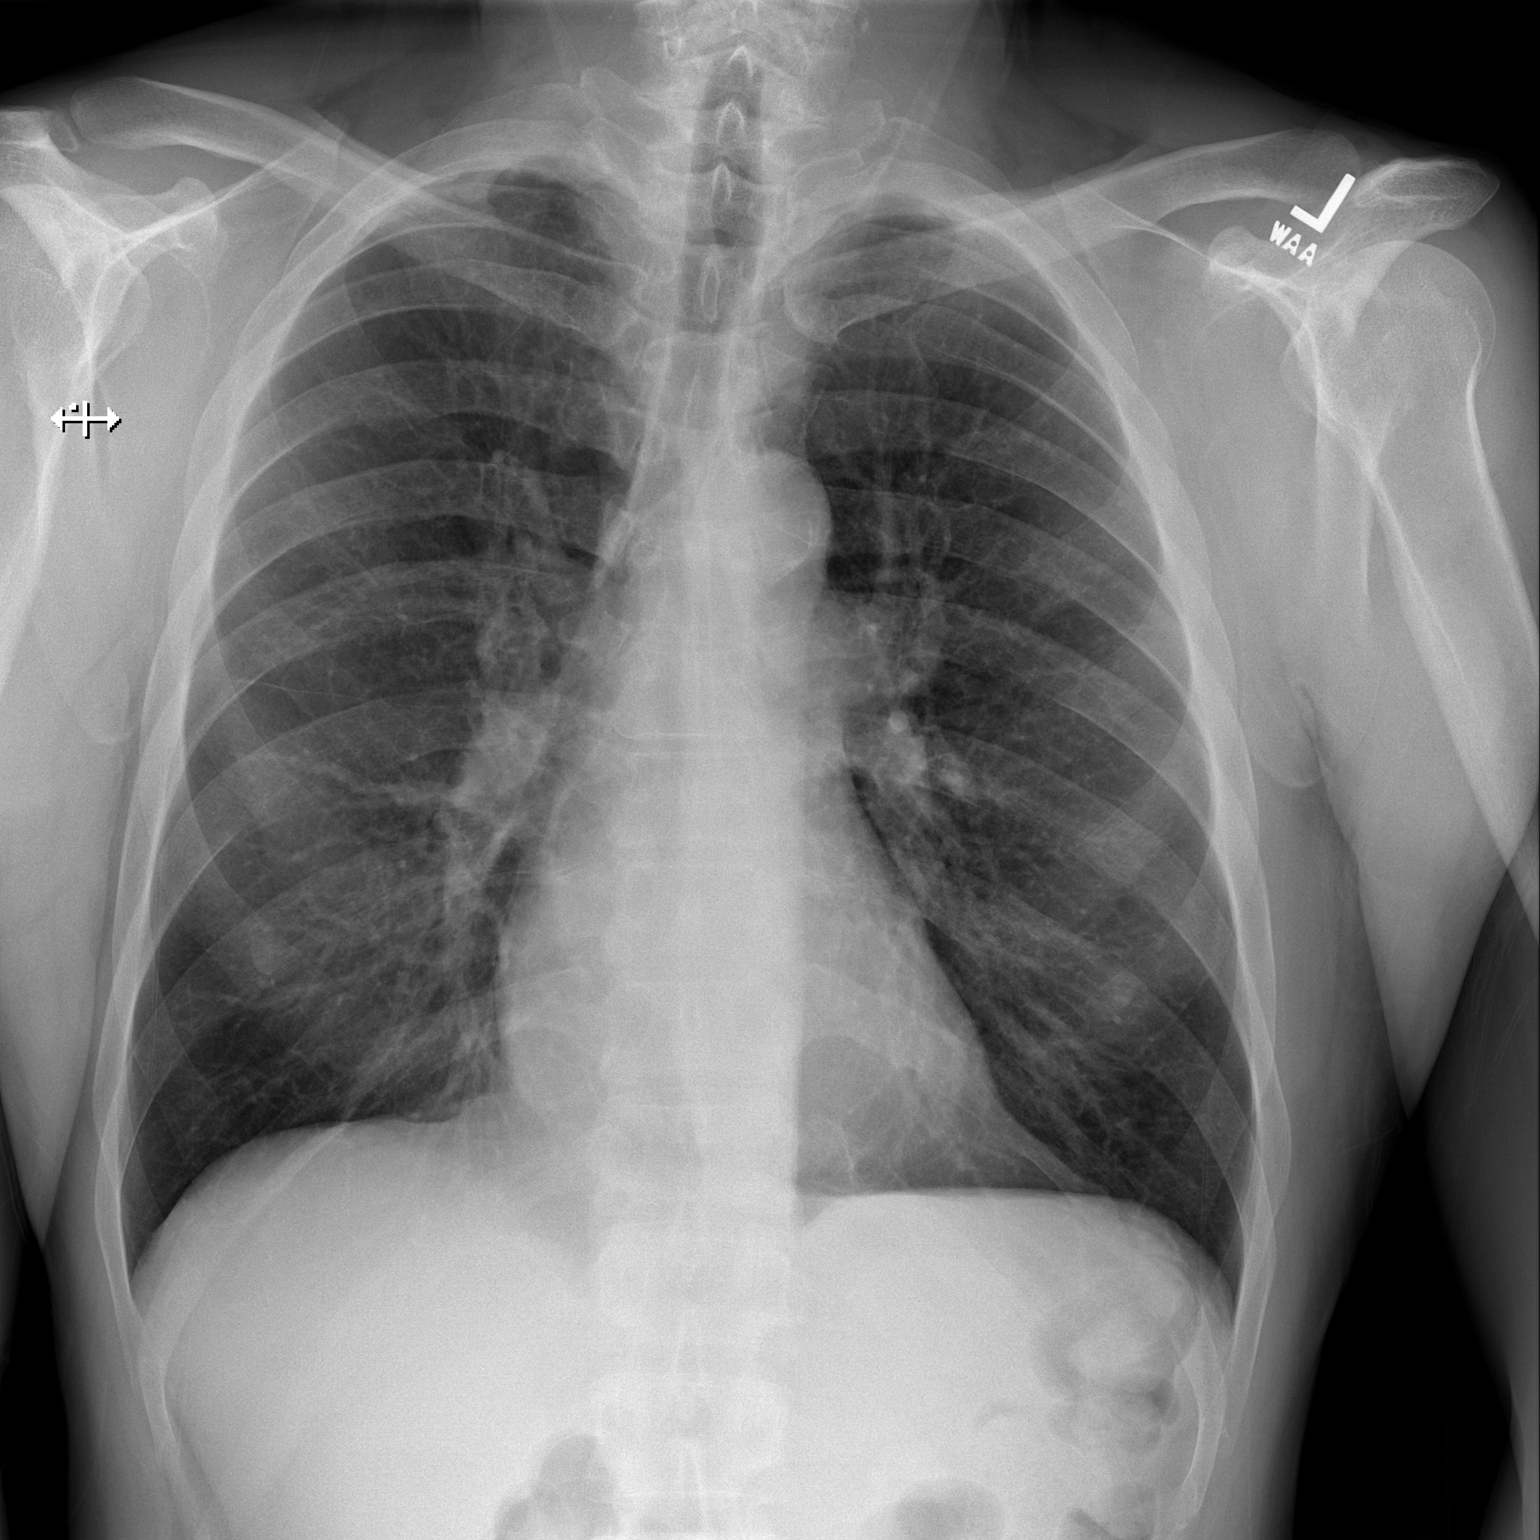
[im 2/2]
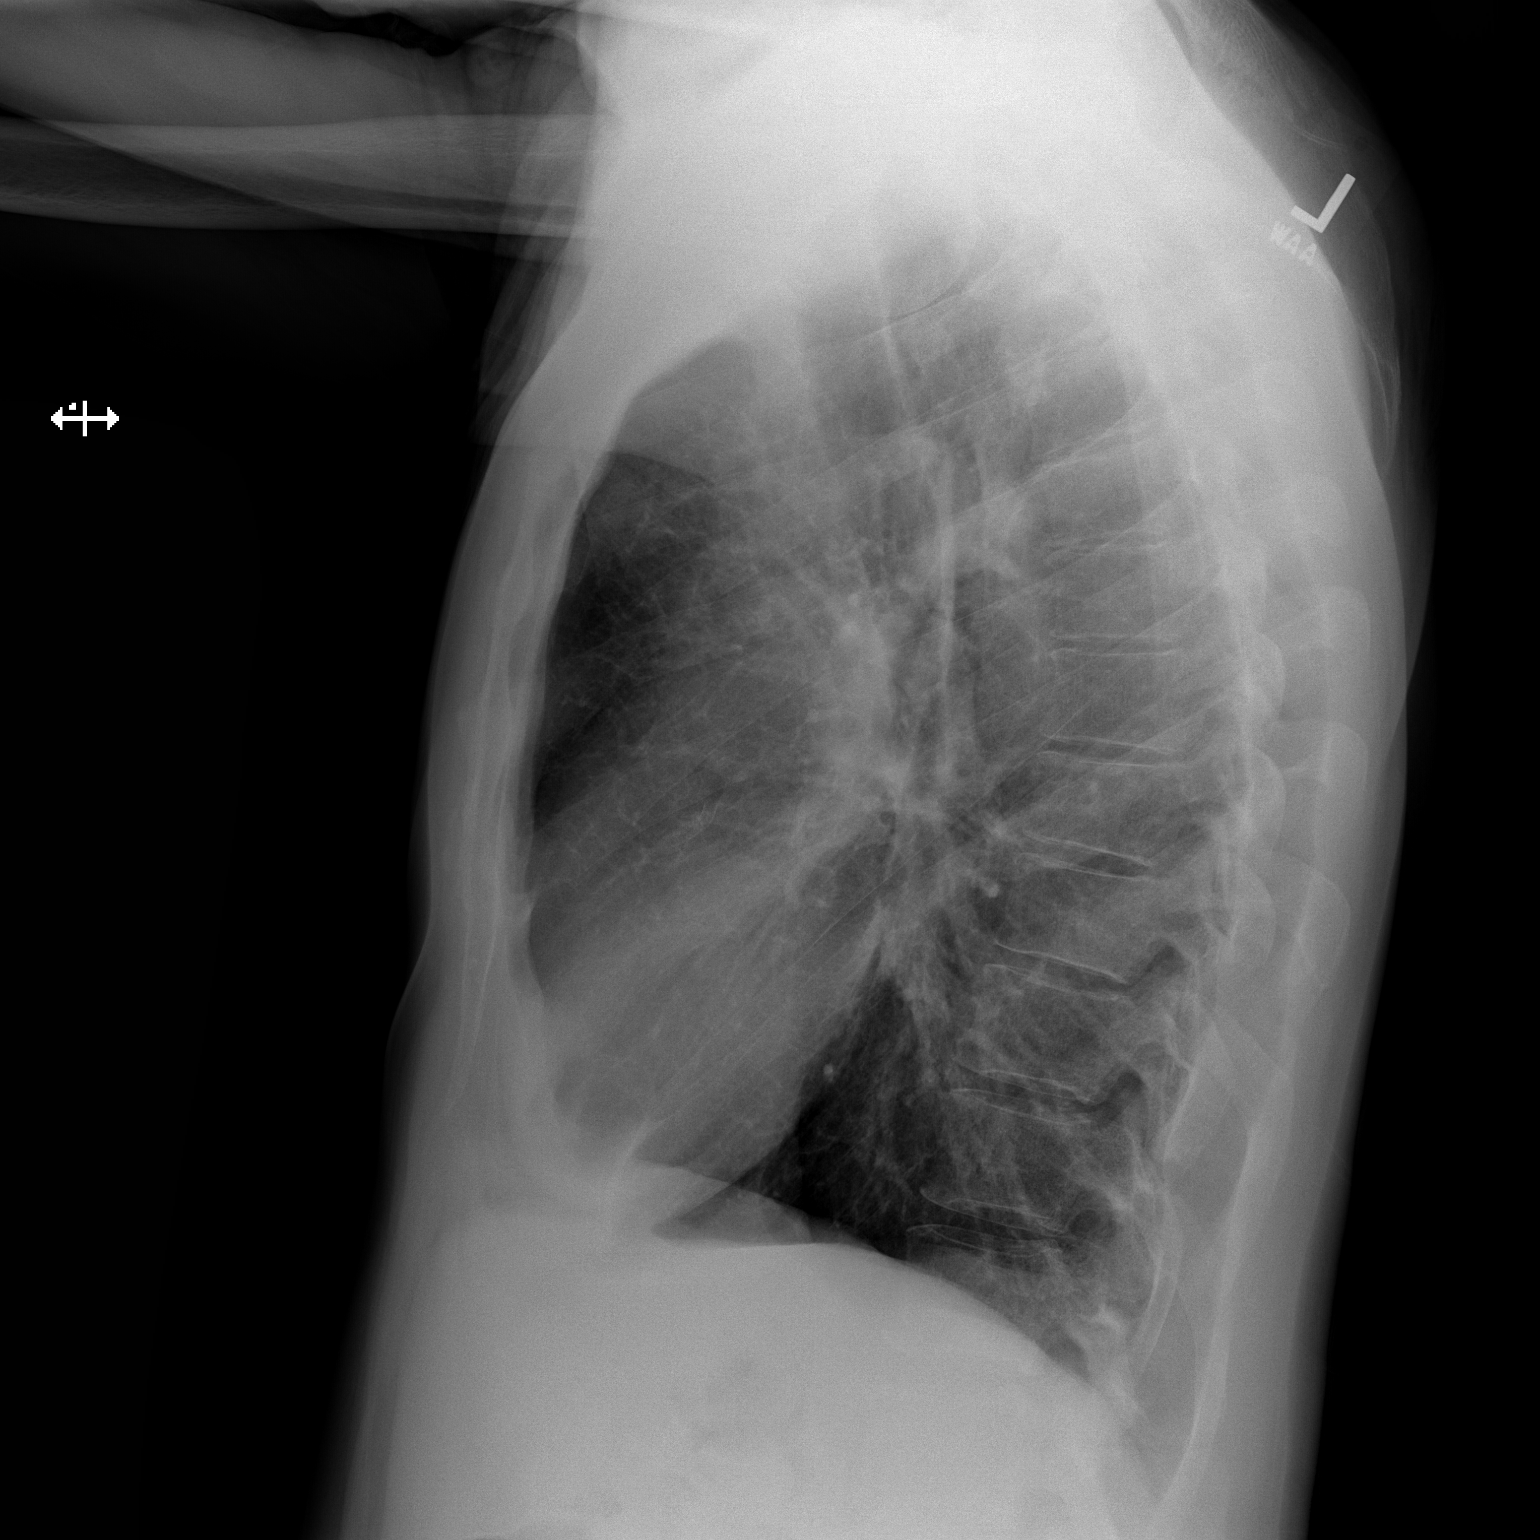

[2 of 2 positions shown; findings below may reference images not displayed]

PROCEDURE:     DXR - DXR CHEST PA (OR AP) AND LATERAL  - May 05, 2012 [DATE]

RESULT:     Comparison is made to the study May 07, 2009.

The lungs are mildly hyperinflated consistent with COPD. There is no focal
pneumonia. No pulmonary parenchymal masses are demonstrated. There is no
pleural effusion or pneumothorax. The cardiac silhouette is normal in size
and the pulmonary vascularity is not engorged.
IMPRESSION: There is mild hyperinflation consistent with COPD. There is
no evidence of metastatic disease to the lungs. There is no evidence of CHF
nor pneumonia.

[REDACTED]

## 2014-09-08 ENCOUNTER — Inpatient Hospital Stay: Payer: Medicare Other | Attending: Hematology and Oncology

## 2014-09-08 DIAGNOSIS — Z452 Encounter for adjustment and management of vascular access device: Secondary | ICD-10-CM | POA: Diagnosis not present

## 2014-09-08 DIAGNOSIS — C679 Malignant neoplasm of bladder, unspecified: Secondary | ICD-10-CM | POA: Insufficient documentation

## 2014-09-08 DIAGNOSIS — Z95828 Presence of other vascular implants and grafts: Secondary | ICD-10-CM

## 2014-09-08 MED ORDER — HEPARIN SOD (PORK) LOCK FLUSH 100 UNIT/ML IV SOLN
500.0000 [IU] | Freq: Once | INTRAVENOUS | Status: AC
  Administered 2014-09-08: 500 [IU] via INTRAVENOUS

## 2014-09-08 MED ORDER — HEPARIN SOD (PORK) LOCK FLUSH 100 UNIT/ML IV SOLN
INTRAVENOUS | Status: AC
  Filled 2014-09-08: qty 5

## 2014-09-08 MED ORDER — SODIUM CHLORIDE 0.9 % IJ SOLN
10.0000 mL | INTRAMUSCULAR | Status: DC | PRN
  Administered 2014-09-08: 10 mL via INTRAVENOUS
  Filled 2014-09-08: qty 10

## 2014-10-15 ENCOUNTER — Other Ambulatory Visit: Payer: Self-pay | Admitting: Internal Medicine

## 2014-10-20 ENCOUNTER — Inpatient Hospital Stay: Payer: Medicare Other

## 2014-10-28 ENCOUNTER — Inpatient Hospital Stay: Payer: Medicare Other | Attending: Hematology and Oncology

## 2014-10-28 DIAGNOSIS — C679 Malignant neoplasm of bladder, unspecified: Secondary | ICD-10-CM | POA: Insufficient documentation

## 2014-10-28 DIAGNOSIS — Z452 Encounter for adjustment and management of vascular access device: Secondary | ICD-10-CM | POA: Diagnosis not present

## 2014-10-28 DIAGNOSIS — Z95828 Presence of other vascular implants and grafts: Secondary | ICD-10-CM

## 2014-10-28 MED ORDER — HEPARIN SOD (PORK) LOCK FLUSH 100 UNIT/ML IV SOLN
INTRAVENOUS | Status: AC
  Filled 2014-10-28: qty 5

## 2014-10-28 MED ORDER — SODIUM CHLORIDE 0.9 % IJ SOLN
10.0000 mL | INTRAMUSCULAR | Status: DC | PRN
  Administered 2014-10-28: 10 mL via INTRAVENOUS
  Filled 2014-10-28: qty 10

## 2014-10-28 MED ORDER — HEPARIN SOD (PORK) LOCK FLUSH 100 UNIT/ML IV SOLN
500.0000 [IU] | Freq: Once | INTRAVENOUS | Status: AC
  Administered 2014-10-28: 500 [IU] via INTRAVENOUS

## 2014-11-19 ENCOUNTER — Ambulatory Visit: Admission: RE | Admit: 2014-11-19 | Payer: Medicare Other | Source: Ambulatory Visit

## 2014-11-24 ENCOUNTER — Ambulatory Visit: Payer: Medicare Other | Admitting: Hematology and Oncology

## 2014-11-24 ENCOUNTER — Other Ambulatory Visit: Payer: Medicare Other

## 2014-12-01 ENCOUNTER — Inpatient Hospital Stay: Payer: Medicare Other

## 2014-12-01 ENCOUNTER — Inpatient Hospital Stay: Payer: Medicare Other | Admitting: Hematology and Oncology

## 2014-12-06 ENCOUNTER — Other Ambulatory Visit: Payer: Self-pay | Admitting: Internal Medicine

## 2014-12-22 ENCOUNTER — Encounter: Payer: Self-pay | Admitting: Hematology and Oncology

## 2014-12-22 ENCOUNTER — Ambulatory Visit: Payer: Medicare Other | Admitting: Hematology and Oncology

## 2014-12-22 ENCOUNTER — Other Ambulatory Visit: Payer: Medicare Other

## 2015-01-12 ENCOUNTER — Inpatient Hospital Stay: Payer: Medicare Other | Attending: Hematology and Oncology

## 2015-01-12 ENCOUNTER — Inpatient Hospital Stay: Payer: Medicare Other | Admitting: Hematology and Oncology

## 2015-01-12 ENCOUNTER — Inpatient Hospital Stay: Payer: Medicare Other

## 2015-02-23 ENCOUNTER — Other Ambulatory Visit: Payer: Self-pay | Admitting: Hematology and Oncology

## 2015-02-23 ENCOUNTER — Encounter: Payer: Self-pay | Admitting: Internal Medicine

## 2015-02-23 ENCOUNTER — Ambulatory Visit (INDEPENDENT_AMBULATORY_CARE_PROVIDER_SITE_OTHER): Payer: Medicare Other | Admitting: Internal Medicine

## 2015-02-23 ENCOUNTER — Inpatient Hospital Stay: Payer: Medicare Other | Attending: Hematology and Oncology

## 2015-02-23 VITALS — BP 130/76 | HR 76 | Ht 72.0 in | Wt 156.0 lb

## 2015-02-23 DIAGNOSIS — Z8551 Personal history of malignant neoplasm of bladder: Secondary | ICD-10-CM

## 2015-02-23 DIAGNOSIS — I10 Essential (primary) hypertension: Secondary | ICD-10-CM

## 2015-02-23 DIAGNOSIS — C679 Malignant neoplasm of bladder, unspecified: Secondary | ICD-10-CM | POA: Diagnosis not present

## 2015-02-23 DIAGNOSIS — Z85850 Personal history of malignant neoplasm of thyroid: Secondary | ICD-10-CM

## 2015-02-23 DIAGNOSIS — Z720 Tobacco use: Secondary | ICD-10-CM

## 2015-02-23 DIAGNOSIS — I739 Peripheral vascular disease, unspecified: Secondary | ICD-10-CM

## 2015-02-23 DIAGNOSIS — M62838 Other muscle spasm: Secondary | ICD-10-CM | POA: Diagnosis not present

## 2015-02-23 DIAGNOSIS — C73 Malignant neoplasm of thyroid gland: Secondary | ICD-10-CM

## 2015-02-23 DIAGNOSIS — Z23 Encounter for immunization: Secondary | ICD-10-CM | POA: Diagnosis not present

## 2015-02-23 DIAGNOSIS — I2510 Atherosclerotic heart disease of native coronary artery without angina pectoris: Secondary | ICD-10-CM | POA: Diagnosis not present

## 2015-02-23 DIAGNOSIS — E890 Postprocedural hypothyroidism: Secondary | ICD-10-CM | POA: Diagnosis not present

## 2015-02-23 DIAGNOSIS — Z452 Encounter for adjustment and management of vascular access device: Secondary | ICD-10-CM | POA: Insufficient documentation

## 2015-02-23 DIAGNOSIS — F4310 Post-traumatic stress disorder, unspecified: Secondary | ICD-10-CM

## 2015-02-23 DIAGNOSIS — E7800 Pure hypercholesterolemia, unspecified: Secondary | ICD-10-CM | POA: Diagnosis not present

## 2015-02-23 DIAGNOSIS — Z95828 Presence of other vascular implants and grafts: Secondary | ICD-10-CM

## 2015-02-23 DIAGNOSIS — F17200 Nicotine dependence, unspecified, uncomplicated: Secondary | ICD-10-CM

## 2015-02-23 LAB — COMPREHENSIVE METABOLIC PANEL
ALBUMIN: 3.9 g/dL (ref 3.5–5.0)
ALT: 12 U/L — ABNORMAL LOW (ref 17–63)
AST: 18 U/L (ref 15–41)
Alkaline Phosphatase: 56 U/L (ref 38–126)
Anion gap: 8 (ref 5–15)
BILIRUBIN TOTAL: 0.5 mg/dL (ref 0.3–1.2)
BUN: 9 mg/dL (ref 6–20)
CO2: 25 mmol/L (ref 22–32)
CREATININE: 0.88 mg/dL (ref 0.61–1.24)
Calcium: 9 mg/dL (ref 8.9–10.3)
Chloride: 101 mmol/L (ref 101–111)
GFR calc Af Amer: >60 mL/min (ref >60)
Glucose, Bld: 101 mg/dL — ABNORMAL HIGH (ref 65–99)
POTASSIUM: 3.9 mmol/L (ref 3.5–5.1)
SODIUM: 134 mmol/L — AB (ref 135–145)
TOTAL PROTEIN: 7.5 g/dL (ref 6.5–8.1)

## 2015-02-23 LAB — CBC WITH DIFFERENTIAL/PLATELET
BASOS PCT: 0 %
Basophils Absolute: 0 10*3/uL (ref 0–0.1)
EOS ABS: 0 10*3/uL (ref 0–0.7)
EOS PCT: 1 %
HCT: 36.2 % — ABNORMAL LOW (ref 40.0–52.0)
HEMOGLOBIN: 11.8 g/dL — AB (ref 13.0–18.0)
Lymphocytes Relative: 35 %
Lymphs Abs: 1.9 10*3/uL (ref 1.0–3.6)
MCH: 29.8 pg (ref 26.0–34.0)
MCHC: 32.7 g/dL (ref 32.0–36.0)
MCV: 91.3 fL (ref 80.0–100.0)
MONOS PCT: 11 %
Monocytes Absolute: 0.6 10*3/uL (ref 0.2–1.0)
NEUTROS PCT: 53 %
Neutro Abs: 3 10*3/uL (ref 1.4–6.5)
PLATELETS: 176 10*3/uL (ref 150–440)
RBC: 3.97 MIL/uL — AB (ref 4.40–5.90)
RDW: 15.2 % — ABNORMAL HIGH (ref 11.5–14.5)
WBC: 5.5 10*3/uL (ref 3.8–10.6)

## 2015-02-23 LAB — LIPID PANEL
CHOL/HDL RATIO: 2.7 ratio
CHOLESTEROL: 119 mg/dL (ref 0–200)
HDL: 44 mg/dL (ref >40)
LDL Cholesterol: 66 mg/dL (ref 0–99)
Triglycerides: 44 mg/dL (ref <150)
VLDL: 9 mg/dL (ref 0–40)

## 2015-02-23 LAB — TSH: TSH: 0.157 u[IU]/mL — ABNORMAL LOW (ref 0.350–4.500)

## 2015-02-23 MED ORDER — SODIUM CHLORIDE 0.9% FLUSH
10.0000 mL | INTRAVENOUS | Status: DC | PRN
  Administered 2015-02-23: 10 mL via INTRAVENOUS
  Filled 2015-02-23: qty 10

## 2015-02-23 MED ORDER — HYDROCODONE-ACETAMINOPHEN 5-325 MG PO TABS: 1.0000 | ORAL_TABLET | Freq: Two times a day (BID) | ORAL | Status: DC | PRN

## 2015-02-23 MED ORDER — HEPARIN SOD (PORK) LOCK FLUSH 100 UNIT/ML IV SOLN
500.0000 [IU] | Freq: Once | INTRAVENOUS | Status: AC
  Administered 2015-02-23: 500 [IU] via INTRAVENOUS
  Filled 2015-02-23: qty 5

## 2015-02-23 NOTE — Patient Instructions (Addendum)
Pneumococcal Conjugate Vaccine (PCV13)  1. Why get vaccinated? Vaccination can protect both children and adults from pneumococcal disease. Pneumococcal disease is caused by bacteria that can spread from person to person through close contact. It can cause ear infections, and it can also lead to more serious infections of the:  Lungs (pneumonia),  Blood (bacteremia), and  Covering of the brain and spinal cord (meningitis). Pneumococcal pneumonia is most common among adults. Pneumococcal meningitis can cause deafness and brain damage, and it kills about 1 child in 10 who get it. Anyone can get pneumococcal disease, but children under 28 years of age and adults 43 years and older, people with certain medical conditions, and cigarette smokers are at the highest risk. Before there was a vaccine, the [place] saw:  more than 700 cases of meningitis,  about 13,000 blood infections,  about 5 million ear infections, and  about 200 deaths in children under 5 each year from pneumococcal disease. Since vaccine became available, severe pneumococcal disease in these children has fallen by 88%. About 18,000 older adults die of pneumococcal disease each year in the [place]. Treatment of pneumococcal infections with penicillin and other drugs is not as effective as it used to be, because some strains of the disease have become resistant to these drugs. This makes prevention of the disease, through vaccination, even more important. 2. PCV13 vaccine Pneumococcal conjugate vaccine (called PCV13) protects against 13 types of pneumococcal bacteria. PCV13 is routinely given to children at 2, 4, 6, and 65-74 months of age. It is also recommended for children and adults 70 to 70 years of age with certain health conditions, and for all adults 64 years of age and older. Your doctor can give you details. 3. Some people should not get this vaccine Anyone who has ever had a life-threatening allergic reaction  to a dose of this vaccine, to an earlier pneumococcal vaccine called PCV7, or to any vaccine containing diphtheria toxoid (for example, DTaP), should not get PCV13. Anyone with a severe allergy to any component of PCV13 should not get the vaccine. Tell your doctor if the person being vaccinated has any severe allergies. If the person scheduled for vaccination is not feeling well, your healthcare provider might decide to reschedule the shot on another day. 4. Risks of a vaccine reaction With any medicine, including vaccines, there is a chance of reactions. These are usually mild and go away on their own, but serious reactions are also possible. Problems reported following PCV13 varied by age and dose in the series. The most common problems reported among children were:  About half became drowsy after the shot, had a temporary loss of appetite, or had redness or tenderness where the shot was given.  About 1 out of 3 had swelling where the shot was given.  About 1 out of 3 had a mild fever, and about 1 in 20 had a fever over 102.55F.  Up to about 8 out of 10 became fussy or irritable. Adults have reported pain, redness, and swelling where the shot was given; also mild fever, fatigue, headache, chills, or muscle pain. Young children who get PCV13 along with inactivated flu vaccine at the same time may be at increased risk for seizures caused by fever. Ask your doctor for more information. Problems that could happen after any vaccine:  People sometimes faint after a medical procedure, including vaccination. Sitting or lying down for about 15 minutes can help prevent fainting, and injuries caused by a fall.  Tell your doctor if you feel dizzy, or have vision changes or ringing in the ears.  Some older children and adults get severe pain in the shoulder and have difficulty moving the arm where a shot was given. This happens very rarely.  Any medication can cause a severe allergic reaction. Such  reactions from a vaccine are very rare, estimated at about 1 in a million doses, and would happen within a few minutes to a few hours after the vaccination. As with any medicine, there is a very small chance of a vaccine causing a serious injury or death. The safety of vaccines is always being monitored. For more information, visit: http://www.aguilar.org/ 5. What if there is a serious reaction? What should I look for?  Look for anything that concerns you, such as signs of a severe allergic reaction, very high fever, or unusual behavior. Signs of a severe allergic reaction can include hives, swelling of the face and throat, difficulty breathing, a fast heartbeat, dizziness, and weakness-usually within a few minutes to a few hours after the vaccination. What should I do?  If you think it is a severe allergic reaction or other emergency that can't wait, call 9-1-1 or get the person to the nearest hospital. Otherwise, call your doctor. Reactions should be reported to the Vaccine Adverse Event Reporting System (VAERS). Your doctor should file this report, or you can do it yourself through the VAERS web site at www.vaers.SamedayNews.es, or by calling 254-183-4063. VAERS does not give medical advice. 6. The National Vaccine Injury Compensation Program The [employer] Vaccine Injury Compensation Program (VICP) is a federal program that was created to compensate people who may have been injured by certain vaccines. Persons who believe they may have been injured by a vaccine can learn about the program and about filing a claim by calling (779) 810-7122 or visiting the [place] website at GoldCloset.com.ee. There is a time limit to file a claim for compensation. 7. How can I learn more?  Ask your healthcare provider. He or she can give you the vaccine package insert or suggest other sources of information.  Call your local or state health department.  Contact the Centers for Disease Control and  Prevention (CDC):  Call (724)376-5627 (1-800-CDC-INFO) or  Visit CDC's website at http://hunter.com/ Vaccine Information Statement PCV13 Vaccine (11/12/2013)   This information is not intended to replace advice given to you by your health care provider. Make sure you discuss any questions you have with your health care provider.   Document Released: 10/22/2005 Document Revised: 01/15/2014 Document Reviewed: 11/19/2013 Elsevier Interactive Patient Education 2016 [place] Maintenance  Topic Date Due  . Hepatitis C Screening  05-13-1950  . TETANUS/TDAP  11/07/1967  . ZOSTAVAX  11/06/2008  . PNA vac Low Risk Adult (1 of 2 - PCV13) 11/06/2013  . INFLUENZA VACCINE  08/09/2015 (Originally 08/09/2014)  . COLONOSCOPY  09/27/2024

## 2015-02-23 NOTE — Progress Notes (Signed)
Patient: Samuel Harris, Male    DOB: Apr 22, 1950, 65 y.o.   MRN: [id number] Visit Date: 02/23/2015  Today's Provider: Halina Maidens, MD   Chief Complaint  Patient presents with  . Medicare Wellness   Subjective:    Annual wellness visit Samuel Harris is a 65 y.o. male who presents today for his Subsequent Annual Wellness Visit. He feels fairly well. He reports exercising doing some yard work. He reports he is sleeping fairly well.   ----------------------------------------------------------- HPI CAD - Patient denies chest pain or shortness of breath.  He continues on amlodipine, zocor and aspirin.  He does not follow up with Cardiology.  Bladder Cancer - He status post bladder and prostate resection with ostomy. He is very happy with this and has no complaints. He denies bleeding irritation or problems with his stoma.  Thyroid mass resection - patient was diagnosed with thyroid carcinoma but he states that after his thyroid resection he was told there was no cancer. He is doing well with a recent follow-up showing him to be stable. He denies trouble swallowing, change in appetite weight bowel habits or cold intolerance.  Depression - he has been on venlafaxine for years. He also suffers from PTSD. Recently he started Wellbutrin to help him quit smoking. He reports that his mood is stable he has good and bad days. Overall he is very satisfied with his current therapy. Review of Systems  Constitutional: Negative for chills, diaphoresis, appetite change, fatigue and unexpected weight change.  HENT: Negative for hearing loss, tinnitus, trouble swallowing and voice change.   Eyes: Negative for visual disturbance.  Respiratory: Negative for choking, shortness of breath and wheezing.   Cardiovascular: Negative for chest pain, palpitations and leg swelling.  Gastrointestinal: Negative for abdominal pain, diarrhea, constipation and blood in stool.  Genitourinary: Negative for  hematuria.  Musculoskeletal: Positive for back pain and arthralgias. Negative for myalgias and neck pain.  Skin: Negative for color change and rash.  Neurological: Negative for dizziness, syncope and headaches.  Hematological: Negative for adenopathy.  Psychiatric/Behavioral: Negative for suicidal ideas, hallucinations, confusion, sleep disturbance and dysphoric mood.    Social History   Social History  . Marital Status: Married    Spouse Name: N/A  . Number of Children: N/A  . Years of Education: N/A   Occupational History  . Not on file.   Social History Main Topics  . Smoking status: Current Every Day Smoker -- 1.00 packs/day for 30 years  . Smokeless tobacco: Former Systems developer    Quit date: 07/26/2014  . Alcohol Use: No  . Drug Use: No  . Sexual Activity: Not on file   Other Topics Concern  . Not on file   Social History Narrative    Patient Active Problem List   Diagnosis Date Noted  . H/O thyroidectomy 07/08/2014  . H/O gastrointestinal disease 07/02/2014  . PTSD (post-traumatic stress disorder) 06/18/2014  . Multiple lung nodules on CT 05/26/2014  . Cancer of thyroid gland ([place]) 05/26/2014  . Arteriosclerosis of coronary artery 04/05/2014  . Current smoker 04/05/2014  . 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 Chronic anemia 10/24/2013  . History of elevated PSA 09/29/2013  . Bladder cancer ([place]) 11/12/2012  . Acid reflux 11/12/2012  . BP (high blood pressure) 11/12/2012  . Hypercholesteremia 11/12/2012  . 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 Allergic rhinitis, seasonal 11/12/2012    Past Surgical History  Procedure Laterality Date  . Cystectomy N/A     Total  .  Transurethral resection of bladder tumor with gyrus (turbt-gyrus)    . Circumcision    . Nasal sinus surgery    . Coronary angioplasty with stent placement    . Total thyroidectomy  06/2014    thyroid cancer    His family history includes Cancer in his brother, mother, and sister.     Previous Medications   AMLODIPINE (NORVASC) 5 MG TABLET    Take 1 tablet (5 mg total) by mouth daily.   ASPIRIN EC 81 MG TABLET    Take by mouth.   ATENOLOL (TENORMIN) 50 MG TABLET    Take 1 tablet (50 mg total) by mouth daily.   FLUTICASONE (FLONASE) 50 MCG/ACT NASAL SPRAY    INHALE 2 SPRAYS IN EACH NOSTRIL EVERY DAY   HYDROCODONE-ACETAMINOPHEN (NORCO/VICODIN) 5-325 MG PER TABLET    Take 1 tablet by mouth 2 (two) times daily as needed for moderate pain.   LEVOTHYROXINE (SYNTHROID, LEVOTHROID) 112 MCG TABLET    Take 1 tablet (112 mcg total) by mouth daily.   OXYCODONE (OXY IR/ROXICODONE) 5 MG IMMEDIATE RELEASE TABLET    Take 5 mg by mouth every 4 (four) hours as needed for severe pain. Reported on 02/23/2015   PROCHLORPERAZINE (COMPAZINE) 10 MG TABLET    Take 10 mg by mouth every 8 (eight) hours as needed for nausea or vomiting.   RANITIDINE (ZANTAC) 150 MG TABLET    TAKE 1 TABLET BY MOUTH TWICE A DAY   SENNA-DOCUSATE (SENOKOT-S) 8.6-50 MG PER TABLET    Take 2 tablets by mouth at bedtime.   SIMVASTATIN (ZOCOR) 20 MG TABLET    TAKE 1 TABLET BY MOUTH AT BEDTIME   VENLAFAXINE XR (EFFEXOR-XR) 150 MG 24 HR CAPSULE    Take 2 capsules (300 mg total) by mouth daily with breakfast.    Patient Care Team: Glean Hess, MD as PCP - General (Family Medicine)     Objective:   Vitals: BP 130/76 mmHg  Pulse 76  Ht 6' (1.829 m)  Wt 156 lb (70.761 kg)  BMI 21.15 kg/m2  Physical Exam  Constitutional: He is oriented to person, place, and time. He appears well-developed and well-nourished. No distress.  HENT:  Head: Normocephalic and atraumatic.  Right Ear: Tympanic membrane, external ear and ear canal normal.  Left Ear: Tympanic membrane, external ear and ear canal normal.  Nose: Nose normal.  Mouth/Throat: Uvula is midline and oropharynx is clear and moist.  Eyes: Conjunctivae and EOM are normal. Pupils are equal, round, and reactive to light.  Neck: Normal range of motion. Neck supple.  Carotid bruit is not present. No thyromegaly present.  Cardiovascular: Normal rate, regular rhythm, normal heart sounds and intact distal pulses.   Pulses:      Dorsalis pedis pulses are 0 on the right side, and 0 on the left side.       Posterior tibial pulses are 1+ on the right side, and 1+ on the left side.  Pulmonary/Chest: Effort normal and breath sounds normal. No respiratory distress. He has no wheezes. Right breast exhibits no mass. Left breast exhibits no mass.  Port in place in right upper chest  Abdominal: Soft. Normal appearance and bowel sounds are normal. There is no hepatosplenomegaly. There is no tenderness. There is CVA tenderness.    Musculoskeletal: Normal range of motion.  Lymphadenopathy:    He has no cervical adenopathy.  Neurological: He is alert and oriented to person, place, and time. He has normal reflexes. No cranial nerve deficit  or sensory deficit.  Skin: Skin is warm, dry and intact. No rash noted.  Psychiatric: He has a normal mood and affect. His speech is normal and behavior is normal. Judgment and thought content normal.  Nursing note and vitals reviewed.   Activities of Daily Living In your present state of health, do you have any difficulty performing the following activities: 02/23/2015 06/23/2014  Hearing? N Y  Vision? Y Y  Difficulty concentrating or making decisions? Y N  Walking or climbing stairs? Y Y  Dressing or bathing? N N  Doing errands, shopping? N N    Fall Risk Assessment Fall Risk  02/23/2015 06/23/2014  Falls in the past year? No No      Depression Screen PHQ 2/9 Scores 02/23/2015 06/23/2014  PHQ - 2 Score 6 0  PHQ- 9 Score 14 -    Cognitive Testing - 6-CIT   Correct? Score   What year is it? yes 0 Yes = 0    No = 4  What month is it? yes 0 Yes = 0    No = 3  Remember:     Pia [address]au, [place], [place]     What time is it? yes 0 Yes = 0    No = 3  Count backwards from 20 to 1 yes 0 Correct = 0    1 error = 2   More  than 1 error = 4  Say the months of the year in reverse. yes 0 Correct = 0    1 error = 2   More than 1 error = 4  What address did I ask you to remember? yes 0 Correct = 0  1 error = 2    2 error = 4    3 error = 6    4 error = 8    All wrong = 10       TOTAL SCORE  0/28   Interpretation:  Normal  Normal (0-7) Abnormal (8-28)        Medicare Annual Wellness Visit Summary:  Reviewed patient's Family Medical History Reviewed and updated list of patient's medical providers Assessment of cognitive impairment was done Assessed patient's functional ability Established a written schedule for health screening [place] Completed and Reviewed  Exercise Activities and Dietary recommendations Goals    None       There is no immunization history on file for this patient.  Health Maintenance  Topic Date Due  . Hepatitis C Screening  April 18, 1950  . TETANUS/TDAP  11/07/1967  . COLONOSCOPY  11/07/1998  . ZOSTAVAX  11/06/2008  . PNA vac Low Risk Adult (1 of 2 - PCV13) 11/06/2013  . INFLUENZA VACCINE  08/09/2015 (Originally 08/09/2014)     Discussed health benefits of physical activity, and encouraged him to engage in regular exercise appropriate for his age and condition.    ------------------------------------------------------------------------------------------------------------   Assessment & Plan:  1. Arteriosclerosis of coronary artery Stable; continue current therapy  2. History of thyroid cancer Patient reports that it was not cancerous and no follow-up is needed He continues on supplements - TSH  3. Personal history of bladder cancer Doing well status post total bladder resection and ostomy placement  4. PTSD (post-traumatic stress disorder) stable  5. Hypercholesteremia On statin therapy - Lipid panel  6. Current smoker Currently on bupropion and cutting back He is aware of the need to quit completely to avoid regression of his peripheral  angiopathy  7.  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) Needs stent placement in the left lower extremity Surgery can be performed once he quit smoking  8. Essential hypertension controlled - CBC with Differential/Platelet - Comprehensive metabolic panel  9. Muscle spasm of right shoulder - HYDROcodone-acetaminophen (NORCO/VICODIN) 5-325 MG tablet; Take 1 tablet by mouth 2 (two) times daily as needed for moderate pain.  Dispense: 60 tablet; Refill: 0  10. Need for pneumococcal vaccination - Pneumococcal conjugate vaccine 13-valent IM   Halina Maidens, MD [facility]  02/23/2015

## 2015-02-24 ENCOUNTER — Telehealth: Payer: Self-pay

## 2015-02-24 NOTE — Telephone Encounter (Signed)
-----   Message from Glean Hess, MD sent at 02/24/2015  1:20 PM EST ----- Cholesterol, thyroid, liver and kidney functions are normal.  Blood count is slightly low.  Oncology can address that with you.

## 2015-02-24 NOTE — Telephone Encounter (Signed)
Left message for patient to call back  

## 2015-03-03 NOTE — Telephone Encounter (Signed)
No answer. Will try again later. 

## 2015-03-11 DIAGNOSIS — C679 Malignant neoplasm of bladder, unspecified: Secondary | ICD-10-CM | POA: Diagnosis not present

## 2015-03-18 NOTE — Telephone Encounter (Signed)
Left message for patient to call back  

## 2015-03-30 ENCOUNTER — Other Ambulatory Visit: Payer: Self-pay

## 2015-03-30 MED ORDER — AMLODIPINE BESYLATE 5 MG PO TABS: 5.0000 mg | ORAL_TABLET | Freq: Every day | ORAL | Status: DC

## 2015-03-30 NOTE — Telephone Encounter (Signed)
Received fax from pharmacy.

## 2015-03-31 DIAGNOSIS — I739 Peripheral vascular disease, unspecified: Secondary | ICD-10-CM | POA: Diagnosis not present

## 2015-03-31 DIAGNOSIS — I10 Essential (primary) hypertension: Secondary | ICD-10-CM | POA: Diagnosis not present

## 2015-03-31 DIAGNOSIS — Z720 Tobacco use: Secondary | ICD-10-CM | POA: Diagnosis not present

## 2015-03-31 DIAGNOSIS — I2510 Atherosclerotic heart disease of native coronary artery without angina pectoris: Secondary | ICD-10-CM | POA: Diagnosis not present

## 2015-03-31 NOTE — Telephone Encounter (Signed)
Tried calling. Will try again.

## 2015-04-06 ENCOUNTER — Inpatient Hospital Stay: Payer: Medicare Other | Attending: Hematology and Oncology

## 2015-04-15 DIAGNOSIS — R590 Localized enlarged lymph nodes: Secondary | ICD-10-CM | POA: Diagnosis not present

## 2015-04-15 DIAGNOSIS — R911 Solitary pulmonary nodule: Secondary | ICD-10-CM | POA: Diagnosis not present

## 2015-04-15 DIAGNOSIS — C679 Malignant neoplasm of bladder, unspecified: Secondary | ICD-10-CM | POA: Diagnosis not present

## 2015-04-15 DIAGNOSIS — F17210 Nicotine dependence, cigarettes, uncomplicated: Secondary | ICD-10-CM | POA: Diagnosis not present

## 2015-04-19 DIAGNOSIS — F4312 Post-traumatic stress disorder, chronic: Secondary | ICD-10-CM | POA: Diagnosis not present

## 2015-05-02 DIAGNOSIS — Z79899 Other long term (current) drug therapy: Secondary | ICD-10-CM | POA: Diagnosis not present

## 2015-05-02 DIAGNOSIS — I739 Peripheral vascular disease, unspecified: Secondary | ICD-10-CM | POA: Diagnosis not present

## 2015-05-02 DIAGNOSIS — I70212 Atherosclerosis of native arteries of extremities with intermittent claudication, left leg: Secondary | ICD-10-CM | POA: Diagnosis not present

## 2015-05-09 HISTORY — PX: FEMORAL ARTERY - FEMORAL ARTERY BYPASS GRAFT: SUR179

## 2015-05-16 DIAGNOSIS — I745 Embolism and thrombosis of iliac artery: Secondary | ICD-10-CM | POA: Diagnosis not present

## 2015-05-17 DIAGNOSIS — F4312 Post-traumatic stress disorder, chronic: Secondary | ICD-10-CM | POA: Diagnosis not present

## 2015-05-18 ENCOUNTER — Inpatient Hospital Stay: Payer: Medicare Other | Attending: Hematology and Oncology

## 2015-05-25 ENCOUNTER — Other Ambulatory Visit: Payer: Self-pay | Admitting: Internal Medicine

## 2015-05-26 DIAGNOSIS — I739 Peripheral vascular disease, unspecified: Secondary | ICD-10-CM | POA: Diagnosis not present

## 2015-05-26 DIAGNOSIS — I2510 Atherosclerotic heart disease of native coronary artery without angina pectoris: Secondary | ICD-10-CM | POA: Diagnosis not present

## 2015-05-26 DIAGNOSIS — I10 Essential (primary) hypertension: Secondary | ICD-10-CM | POA: Diagnosis not present

## 2015-05-26 DIAGNOSIS — F17200 Nicotine dependence, unspecified, uncomplicated: Secondary | ICD-10-CM | POA: Diagnosis not present

## 2015-06-01 ENCOUNTER — Other Ambulatory Visit: Payer: Self-pay | Admitting: Internal Medicine

## 2015-06-03 DIAGNOSIS — Z5181 Encounter for therapeutic drug level monitoring: Secondary | ICD-10-CM | POA: Diagnosis not present

## 2015-06-03 DIAGNOSIS — D638 Anemia in other chronic diseases classified elsewhere: Secondary | ICD-10-CM | POA: Diagnosis not present

## 2015-06-03 DIAGNOSIS — I2510 Atherosclerotic heart disease of native coronary artery without angina pectoris: Secondary | ICD-10-CM | POA: Diagnosis not present

## 2015-06-03 DIAGNOSIS — I10 Essential (primary) hypertension: Secondary | ICD-10-CM | POA: Diagnosis not present

## 2015-06-03 DIAGNOSIS — I739 Peripheral vascular disease, unspecified: Secondary | ICD-10-CM | POA: Diagnosis not present

## 2015-06-03 DIAGNOSIS — Z79899 Other long term (current) drug therapy: Secondary | ICD-10-CM | POA: Diagnosis not present

## 2015-06-03 DIAGNOSIS — E7800 Pure hypercholesterolemia, unspecified: Secondary | ICD-10-CM | POA: Diagnosis not present

## 2015-06-08 DIAGNOSIS — Z87891 Personal history of nicotine dependence: Secondary | ICD-10-CM | POA: Diagnosis not present

## 2015-06-08 DIAGNOSIS — I70212 Atherosclerosis of native arteries of extremities with intermittent claudication, left leg: Secondary | ICD-10-CM | POA: Diagnosis not present

## 2015-07-01 ENCOUNTER — Other Ambulatory Visit: Payer: Self-pay | Admitting: Internal Medicine

## 2015-08-19 ENCOUNTER — Other Ambulatory Visit: Payer: Self-pay | Admitting: Internal Medicine

## 2015-08-19 DIAGNOSIS — I10 Essential (primary) hypertension: Secondary | ICD-10-CM

## 2015-11-22 DIAGNOSIS — C679 Malignant neoplasm of bladder, unspecified: Secondary | ICD-10-CM | POA: Diagnosis not present

## 2015-12-17 ENCOUNTER — Other Ambulatory Visit: Payer: Self-pay | Admitting: Internal Medicine

## 2015-12-22 DIAGNOSIS — I779 Disorder of arteries and arterioles, unspecified: Secondary | ICD-10-CM | POA: Diagnosis not present

## 2015-12-23 DIAGNOSIS — C7951 Secondary malignant neoplasm of bone: Secondary | ICD-10-CM | POA: Diagnosis not present

## 2015-12-23 DIAGNOSIS — E890 Postprocedural hypothyroidism: Secondary | ICD-10-CM | POA: Diagnosis not present

## 2015-12-23 DIAGNOSIS — F17210 Nicotine dependence, cigarettes, uncomplicated: Secondary | ICD-10-CM | POA: Diagnosis not present

## 2015-12-23 DIAGNOSIS — C679 Malignant neoplasm of bladder, unspecified: Secondary | ICD-10-CM | POA: Diagnosis not present

## 2015-12-23 DIAGNOSIS — C61 Malignant neoplasm of prostate: Secondary | ICD-10-CM | POA: Diagnosis not present

## 2015-12-23 DIAGNOSIS — Z5112 Encounter for antineoplastic immunotherapy: Secondary | ICD-10-CM | POA: Diagnosis not present

## 2015-12-24 ENCOUNTER — Other Ambulatory Visit: Payer: Self-pay | Admitting: Internal Medicine

## 2015-12-27 ENCOUNTER — Ambulatory Visit: Payer: No Typology Code available for payment source | Admitting: Internal Medicine

## 2016-01-13 DIAGNOSIS — Z5112 Encounter for antineoplastic immunotherapy: Secondary | ICD-10-CM | POA: Diagnosis not present

## 2016-01-13 DIAGNOSIS — Z8546 Personal history of malignant neoplasm of prostate: Secondary | ICD-10-CM | POA: Diagnosis not present

## 2016-01-13 DIAGNOSIS — Z906 Acquired absence of other parts of urinary tract: Secondary | ICD-10-CM | POA: Diagnosis not present

## 2016-01-13 DIAGNOSIS — C7800 Secondary malignant neoplasm of unspecified lung: Secondary | ICD-10-CM | POA: Diagnosis not present

## 2016-01-13 DIAGNOSIS — F17210 Nicotine dependence, cigarettes, uncomplicated: Secondary | ICD-10-CM | POA: Diagnosis not present

## 2016-01-13 DIAGNOSIS — C7951 Secondary malignant neoplasm of bone: Secondary | ICD-10-CM | POA: Diagnosis not present

## 2016-01-13 DIAGNOSIS — C7982 Secondary malignant neoplasm of genital organs: Secondary | ICD-10-CM | POA: Diagnosis not present

## 2016-01-13 DIAGNOSIS — Z9079 Acquired absence of other genital organ(s): Secondary | ICD-10-CM | POA: Diagnosis not present

## 2016-01-13 DIAGNOSIS — R9349 Abnormal radiologic findings on diagnostic imaging of other urinary organs: Secondary | ICD-10-CM | POA: Diagnosis not present

## 2016-01-13 DIAGNOSIS — C679 Malignant neoplasm of bladder, unspecified: Secondary | ICD-10-CM | POA: Diagnosis not present

## 2016-01-13 DIAGNOSIS — Z79899 Other long term (current) drug therapy: Secondary | ICD-10-CM | POA: Diagnosis not present

## 2016-01-17 DIAGNOSIS — F4312 Post-traumatic stress disorder, chronic: Secondary | ICD-10-CM | POA: Diagnosis not present

## 2016-02-02 DIAGNOSIS — C7951 Secondary malignant neoplasm of bone: Secondary | ICD-10-CM | POA: Diagnosis not present

## 2016-02-02 DIAGNOSIS — Z5112 Encounter for antineoplastic immunotherapy: Secondary | ICD-10-CM | POA: Diagnosis not present

## 2016-02-02 DIAGNOSIS — C679 Malignant neoplasm of bladder, unspecified: Secondary | ICD-10-CM | POA: Diagnosis not present

## 2016-02-24 DIAGNOSIS — F17210 Nicotine dependence, cigarettes, uncomplicated: Secondary | ICD-10-CM | POA: Diagnosis not present

## 2016-02-24 DIAGNOSIS — C7951 Secondary malignant neoplasm of bone: Secondary | ICD-10-CM | POA: Diagnosis not present

## 2016-02-24 DIAGNOSIS — C678 Malignant neoplasm of overlapping sites of bladder: Secondary | ICD-10-CM | POA: Diagnosis not present

## 2016-02-24 DIAGNOSIS — Z5112 Encounter for antineoplastic immunotherapy: Secondary | ICD-10-CM | POA: Diagnosis not present

## 2016-02-24 DIAGNOSIS — R9349 Abnormal radiologic findings on diagnostic imaging of other urinary organs: Secondary | ICD-10-CM | POA: Diagnosis not present

## 2016-02-24 DIAGNOSIS — R918 Other nonspecific abnormal finding of lung field: Secondary | ICD-10-CM | POA: Diagnosis not present

## 2016-02-24 DIAGNOSIS — Z79899 Other long term (current) drug therapy: Secondary | ICD-10-CM | POA: Diagnosis not present

## 2016-02-24 DIAGNOSIS — C679 Malignant neoplasm of bladder, unspecified: Secondary | ICD-10-CM | POA: Diagnosis not present

## 2016-03-15 DIAGNOSIS — C7802 Secondary malignant neoplasm of left lung: Secondary | ICD-10-CM | POA: Diagnosis not present

## 2016-03-15 DIAGNOSIS — Z01818 Encounter for other preprocedural examination: Secondary | ICD-10-CM | POA: Diagnosis not present

## 2016-03-15 DIAGNOSIS — F17210 Nicotine dependence, cigarettes, uncomplicated: Secondary | ICD-10-CM | POA: Diagnosis not present

## 2016-03-15 DIAGNOSIS — C779 Secondary and unspecified malignant neoplasm of lymph node, unspecified: Secondary | ICD-10-CM | POA: Diagnosis not present

## 2016-03-15 DIAGNOSIS — Z906 Acquired absence of other parts of urinary tract: Secondary | ICD-10-CM | POA: Diagnosis not present

## 2016-03-15 DIAGNOSIS — C799 Secondary malignant neoplasm of unspecified site: Secondary | ICD-10-CM | POA: Diagnosis not present

## 2016-03-15 DIAGNOSIS — C7951 Secondary malignant neoplasm of bone: Secondary | ICD-10-CM | POA: Diagnosis not present

## 2016-03-15 DIAGNOSIS — Z8551 Personal history of malignant neoplasm of bladder: Secondary | ICD-10-CM | POA: Diagnosis not present

## 2016-03-15 DIAGNOSIS — C679 Malignant neoplasm of bladder, unspecified: Secondary | ICD-10-CM | POA: Diagnosis not present

## 2016-03-15 DIAGNOSIS — T451X5A Adverse effect of antineoplastic and immunosuppressive drugs, initial encounter: Secondary | ICD-10-CM | POA: Diagnosis not present

## 2016-03-15 DIAGNOSIS — Z5112 Encounter for antineoplastic immunotherapy: Secondary | ICD-10-CM | POA: Diagnosis not present

## 2016-03-15 DIAGNOSIS — Z5181 Encounter for therapeutic drug level monitoring: Secondary | ICD-10-CM | POA: Diagnosis not present

## 2016-03-15 DIAGNOSIS — Z79899 Other long term (current) drug therapy: Secondary | ICD-10-CM | POA: Diagnosis not present

## 2016-03-15 DIAGNOSIS — D6481 Anemia due to antineoplastic chemotherapy: Secondary | ICD-10-CM | POA: Diagnosis not present

## 2016-03-22 DIAGNOSIS — R918 Other nonspecific abnormal finding of lung field: Secondary | ICD-10-CM | POA: Diagnosis not present

## 2016-03-22 DIAGNOSIS — R9349 Abnormal radiologic findings on diagnostic imaging of other urinary organs: Secondary | ICD-10-CM | POA: Diagnosis not present

## 2016-03-22 DIAGNOSIS — Z9079 Acquired absence of other genital organ(s): Secondary | ICD-10-CM | POA: Diagnosis not present

## 2016-03-22 DIAGNOSIS — J439 Emphysema, unspecified: Secondary | ICD-10-CM | POA: Diagnosis not present

## 2016-03-22 DIAGNOSIS — Z906 Acquired absence of other parts of urinary tract: Secondary | ICD-10-CM | POA: Diagnosis not present

## 2016-03-22 DIAGNOSIS — C679 Malignant neoplasm of bladder, unspecified: Secondary | ICD-10-CM | POA: Diagnosis not present

## 2016-03-22 DIAGNOSIS — C7951 Secondary malignant neoplasm of bone: Secondary | ICD-10-CM | POA: Diagnosis not present

## 2016-03-22 DIAGNOSIS — I708 Atherosclerosis of other arteries: Secondary | ICD-10-CM | POA: Diagnosis not present

## 2016-03-24 ENCOUNTER — Other Ambulatory Visit: Payer: Self-pay | Admitting: Internal Medicine

## 2016-03-24 ENCOUNTER — Encounter: Payer: Self-pay | Admitting: Internal Medicine

## 2016-03-28 NOTE — Telephone Encounter (Signed)
pts coming in on 4/6

## 2016-04-03 DIAGNOSIS — C7800 Secondary malignant neoplasm of unspecified lung: Secondary | ICD-10-CM | POA: Diagnosis not present

## 2016-04-03 DIAGNOSIS — D649 Anemia, unspecified: Secondary | ICD-10-CM | POA: Diagnosis not present

## 2016-04-03 DIAGNOSIS — Z5112 Encounter for antineoplastic immunotherapy: Secondary | ICD-10-CM | POA: Diagnosis not present

## 2016-04-03 DIAGNOSIS — C772 Secondary and unspecified malignant neoplasm of intra-abdominal lymph nodes: Secondary | ICD-10-CM | POA: Diagnosis not present

## 2016-04-03 DIAGNOSIS — C7951 Secondary malignant neoplasm of bone: Secondary | ICD-10-CM | POA: Diagnosis not present

## 2016-04-03 DIAGNOSIS — R935 Abnormal findings on diagnostic imaging of other abdominal regions, including retroperitoneum: Secondary | ICD-10-CM | POA: Diagnosis not present

## 2016-04-03 DIAGNOSIS — F17210 Nicotine dependence, cigarettes, uncomplicated: Secondary | ICD-10-CM | POA: Diagnosis not present

## 2016-04-03 DIAGNOSIS — C679 Malignant neoplasm of bladder, unspecified: Secondary | ICD-10-CM | POA: Diagnosis not present

## 2016-04-13 ENCOUNTER — Ambulatory Visit: Payer: Medicare Other | Admitting: Internal Medicine

## 2016-04-17 ENCOUNTER — Other Ambulatory Visit: Payer: Self-pay | Admitting: Internal Medicine

## 2016-04-18 ENCOUNTER — Ambulatory Visit (INDEPENDENT_AMBULATORY_CARE_PROVIDER_SITE_OTHER): Payer: Medicare Other | Admitting: Internal Medicine

## 2016-04-18 ENCOUNTER — Encounter: Payer: Self-pay | Admitting: Internal Medicine

## 2016-04-18 VITALS — BP 136/82 | HR 62 | Ht 72.0 in | Wt 149.2 lb

## 2016-04-18 DIAGNOSIS — I739 Peripheral vascular disease, unspecified: Secondary | ICD-10-CM

## 2016-04-18 DIAGNOSIS — F17200 Nicotine dependence, unspecified, uncomplicated: Secondary | ICD-10-CM | POA: Diagnosis not present

## 2016-04-18 DIAGNOSIS — C7951 Secondary malignant neoplasm of bone: Secondary | ICD-10-CM

## 2016-04-18 DIAGNOSIS — Z23 Encounter for immunization: Secondary | ICD-10-CM

## 2016-04-18 DIAGNOSIS — I10 Essential (primary) hypertension: Secondary | ICD-10-CM | POA: Diagnosis not present

## 2016-04-18 DIAGNOSIS — C679 Malignant neoplasm of bladder, unspecified: Secondary | ICD-10-CM | POA: Diagnosis not present

## 2016-04-18 MED ORDER — VENLAFAXINE HCL ER 150 MG PO CP24: 300.0000 mg | ORAL_CAPSULE | Freq: Every day | ORAL | 3 refills | Status: AC

## 2016-04-18 MED ORDER — FLUTICASONE PROPIONATE 50 MCG/ACT NA SUSP: 2.0000 | Freq: Every day | NASAL | 12 refills | Status: AC

## 2016-04-18 MED ORDER — CYCLOBENZAPRINE HCL 10 MG PO TABS
10.0000 mg | ORAL_TABLET | Freq: Three times a day (TID) | ORAL | 0 refills | Status: AC | PRN
Start: 2016-04-18 — End: ?

## 2016-04-18 NOTE — Progress Notes (Signed)
Date:  04/18/2016   Name:  Samuel Harris   DOB:  1950/07/25   MRN:  [id number]   Chief Complaint: Hypertension and Back Pain Hypertension  This is a chronic problem. The problem is unchanged. The problem is controlled. Pertinent negatives include no chest pain, headaches, palpitations or shortness of breath.  Back Pain  This is a new problem. The current episode started more than 1 month ago. The pain is present in the lumbar spine (related to metastatic cancer). Pertinent negatives include no abdominal pain, chest pain, fever or headaches. Risk factors include history of cancer.  PVD - s/p bilateral iliac stents with good tolerance of moderate exercise.  On Plavix with no bleeding issues.  He does continue to smoke but is trying to cut back.  Bladder cancer - now metastatic to bone.  He had back pain as above and is now getting a more targeted chemotherapy.  He is doing well with his urostomy.    Review of Systems  Constitutional: Positive for appetite change. Negative for chills, fatigue, fever and unexpected weight change.  HENT: Positive for sinus pressure. Negative for sore throat and trouble swallowing.   Eyes: Negative for visual disturbance.  Respiratory: Negative for chest tightness, shortness of breath and wheezing.   Cardiovascular: Negative for chest pain and palpitations.  Gastrointestinal: Positive for constipation. Negative for abdominal pain.  Genitourinary: Negative for hematuria.  Musculoskeletal: Positive for back pain. Negative for gait problem.  Skin: Negative for rash.  Neurological: Negative for dizziness, tremors, syncope and headaches.  Hematological: Does not bruise/bleed easily.  Psychiatric/Behavioral: Negative for dysphoric mood and sleep disturbance. The patient is not nervous/anxious.     Patient Active Problem List   Diagnosis Date Noted  . H/O gastrointestinal disease 07/02/2014  . PTSD (post-traumatic stress disorder) 06/18/2014  .  Multiple lung nodules on CT 05/26/2014  . History of thyroid cancer 05/26/2014  . Coronary artery disease involving native heart without angina pectoris 04/05/2014  . Current smoker 04/05/2014  . Anemia due to chronic illness 10/24/2013  . History of prostate cancer 09/29/2013  . Bladder cancer metastasized to bone ([place]) 11/12/2012  . GERD (gastroesophageal reflux disease) 11/12/2012  . Essential hypertension 11/12/2012  . Hypercholesteremia 11/12/2012  . PAD (peripheral artery disease) ([place]) 11/12/2012  . Allergic rhinitis, seasonal 11/12/2012    Prior to Admission medications   Medication Sig Start Date End Date Taking? Authorizing Provider  amLODipine (NORVASC) 5 MG tablet TAKE 1 TABLET (5 MG TOTAL) BY MOUTH DAILY. 03/24/16   Glean Hess, MD  aspirin EC 81 MG tablet Take by mouth.    Historical Provider, MD  atenolol (TENORMIN) 50 MG tablet TAKE 1 TABLET (50 MG TOTAL) BY MOUTH DAILY. 08/19/15   Glean Hess, MD  buPROPion (WELLBUTRIN XL) 300 MG 24 hr tablet Take 300 mg by mouth daily.    Historical Provider, MD  clopidogrel (PLAVIX) 75 MG tablet Take 1 tablet by mouth daily. 03/19/16   Historical Provider, MD  fluticasone (FLONASE) 50 MCG/ACT nasal spray INHALE 2 SPRAYS IN EACH NOSTRIL EVERY DAY 05/26/15   Glean Hess, MD  levothyroxine (SYNTHROID, LEVOTHROID) 112 MCG tablet TAKE 1 TABLET BY MOUTH EVERY DAY 07/03/15   Glean Hess, MD  magnesium oxide (MAG-OX) 400 (241.3 Mg) MG tablet Take 1 tablet by mouth 2 (two) times daily. 03/15/16   Historical Provider, MD  oxyCODONE (OXY IR/ROXICODONE) 5 MG immediate release tablet Take 5 mg by mouth every 4 (four) hours  as needed for severe pain. Reported on 02/23/2015    Historical Provider, MD  prochlorperazine (COMPAZINE) 10 MG tablet Take 10 mg by mouth every 8 (eight) hours as needed for nausea or vomiting.    Historical Provider, MD  ranitidine (ZANTAC) 150 MG tablet TAKE 1 TABLET BY MOUTH TWICE A DAY 12/18/15   Glean Hess,  MD  senna-docusate (SENOKOT-S) 8.6-50 MG per tablet Take 2 tablets by mouth at bedtime.    Historical Provider, MD  simvastatin (ZOCOR) 20 MG tablet TAKE 1 TABLET BY MOUTH AT BEDTIME 12/26/15   Glean Hess, MD  venlafaxine XR (EFFEXOR-XR) 150 MG 24 hr capsule TAKE 2 CAPSULES BY MOUTH EVERY DAY 06/01/15   Glean Hess, MD    Allergies  Allergen Reactions  . Penicillins Hives    Past Surgical History:  Procedure Laterality Date  . CIRCUMCISION    . CORONARY ANGIOPLASTY WITH STENT PLACEMENT    . CYSTECTOMY N/A    Total  . FEMORAL ARTERY - FEMORAL ARTERY BYPASS GRAFT  05/2015  . INSERTION CENTRAL VENOUS ACCESS DEVICE W/ SUBCUTANEOUS PORT  05/2012  . NASAL SINUS SURGERY    . TOTAL THYROIDECTOMY  06/2014   thyroid cancer  . TRANSURETHRAL RESECTION OF BLADDER TUMOR WITH GYRUS (TURBT-GYRUS)      Social History  Substance Use Topics  . Smoking status: Current Every Day Smoker    Packs/day: 1.00    Years: 30.00  . Smokeless tobacco: Former Systems developer    Quit date: 07/26/2014     Comment: cutting down to quit.  . Alcohol use No     Medication list has been reviewed and updated.   Physical Exam  Constitutional: He is oriented to person, place, and time. He appears well-developed. No distress.  HENT:  Head: Normocephalic and atraumatic.  Mouth/Throat: No posterior oropharyngeal edema or posterior oropharyngeal erythema.  Neck: Carotid bruit is not present.  Cardiovascular: Normal rate, regular rhythm and normal heart sounds.   Pulmonary/Chest: Effort normal. No respiratory distress. He has no decreased breath sounds. He has no wheezes. He has no rhonchi.  Musculoskeletal: Normal range of motion.  Neurological: He is alert and oriented to person, place, and time.  Skin: Skin is warm and dry. No rash noted.  Psychiatric: He has a normal mood and affect. His speech is normal and behavior is normal. Thought content normal.  Nursing note and vitals reviewed.   BP 136/82 (BP  Location: Right Arm, Patient Position: Sitting, Cuff Size: Normal)   Pulse 62   Ht 6' (1.829 m)   Wt 149 lb 3.2 oz (67.7 kg)   BMI 20.24 kg/m   Assessment and Plan: 1. Essential hypertension controlled  2. PAD (peripheral artery disease) ([facility]) Stable since intervention Continue Plavix  3. Bladder cancer metastasized to bone [facility]) Followed by Oncology Can take cyclobenzaprine daily as needed for lumbar muscle spasm  4. Need for pneumococcal vaccination - Pneumococcal polysaccharide vaccine 23-valent greater than or equal to 2yo subcutaneous/IM  5. Current smoker Encouraged pt to continue cutting back   Meds ordered this encounter  Medications  . venlafaxine XR (EFFEXOR-XR) 150 MG 24 hr capsule    Sig: Take 2 capsules (300 mg total) by mouth daily.    Dispense:  180 capsule    Refill:  3  . fluticasone (FLONASE) 50 MCG/ACT nasal spray    Sig: Place 2 sprays into both nostrils daily.    Dispense:  16 g    Refill:  12  .  cyclobenzaprine (FLEXERIL) 10 MG tablet    Sig: Take 1 tablet (10 mg total) by mouth 3 (three) times daily as needed for muscle spasms.    Dispense:  30 tablet    Refill:  0    Halina Maidens, MD [facility]  04/18/2016

## 2016-04-19 ENCOUNTER — Telehealth: Payer: Self-pay

## 2016-04-19 ENCOUNTER — Other Ambulatory Visit: Payer: Self-pay | Admitting: Internal Medicine

## 2016-04-19 NOTE — Telephone Encounter (Signed)
Pt wife called stating pt arm is swollen after receiving shot yesterday. I called pt to confirm. He said it's only swollen right where I gave the shot. I asked if he had any symptoms of headache, fever, or swelling around face/mouth. He said no. I informed him that he probably just had reaction to shot but not ALLERGIC reaction. Informed him to give me a call if worsens.

## 2016-04-20 DIAGNOSIS — M79604 Pain in right leg: Secondary | ICD-10-CM | POA: Diagnosis not present

## 2016-04-20 DIAGNOSIS — C679 Malignant neoplasm of bladder, unspecified: Secondary | ICD-10-CM | POA: Diagnosis not present

## 2016-04-20 DIAGNOSIS — M5441 Lumbago with sciatica, right side: Secondary | ICD-10-CM | POA: Diagnosis not present

## 2016-04-20 DIAGNOSIS — M5442 Lumbago with sciatica, left side: Secondary | ICD-10-CM | POA: Diagnosis not present

## 2016-04-20 DIAGNOSIS — M545 Low back pain: Secondary | ICD-10-CM | POA: Diagnosis not present

## 2016-04-20 DIAGNOSIS — C801 Malignant (primary) neoplasm, unspecified: Secondary | ICD-10-CM | POA: Diagnosis not present

## 2016-04-20 DIAGNOSIS — C7951 Secondary malignant neoplasm of bone: Secondary | ICD-10-CM | POA: Diagnosis not present

## 2016-04-20 DIAGNOSIS — Z79899 Other long term (current) drug therapy: Secondary | ICD-10-CM | POA: Diagnosis not present

## 2016-04-20 DIAGNOSIS — F17210 Nicotine dependence, cigarettes, uncomplicated: Secondary | ICD-10-CM | POA: Diagnosis not present

## 2016-04-20 DIAGNOSIS — M79605 Pain in left leg: Secondary | ICD-10-CM | POA: Diagnosis not present

## 2016-04-26 DIAGNOSIS — C679 Malignant neoplasm of bladder, unspecified: Secondary | ICD-10-CM | POA: Diagnosis not present

## 2016-04-26 DIAGNOSIS — C7951 Secondary malignant neoplasm of bone: Secondary | ICD-10-CM | POA: Diagnosis not present

## 2016-04-26 DIAGNOSIS — C799 Secondary malignant neoplasm of unspecified site: Secondary | ICD-10-CM | POA: Diagnosis not present

## 2016-04-26 DIAGNOSIS — R9341 Abnormal radiologic findings on diagnostic imaging of renal pelvis, ureter, or bladder: Secondary | ICD-10-CM | POA: Diagnosis not present

## 2016-05-02 DIAGNOSIS — Z510 Encounter for antineoplastic radiation therapy: Secondary | ICD-10-CM | POA: Diagnosis not present

## 2016-05-02 DIAGNOSIS — C7951 Secondary malignant neoplasm of bone: Secondary | ICD-10-CM | POA: Diagnosis not present

## 2016-05-03 DIAGNOSIS — Z510 Encounter for antineoplastic radiation therapy: Secondary | ICD-10-CM | POA: Diagnosis not present

## 2016-05-03 DIAGNOSIS — C7951 Secondary malignant neoplasm of bone: Secondary | ICD-10-CM | POA: Diagnosis not present

## 2016-05-04 DIAGNOSIS — Z510 Encounter for antineoplastic radiation therapy: Secondary | ICD-10-CM | POA: Diagnosis not present

## 2016-05-04 DIAGNOSIS — C7951 Secondary malignant neoplasm of bone: Secondary | ICD-10-CM | POA: Diagnosis not present

## 2016-05-07 DIAGNOSIS — Z510 Encounter for antineoplastic radiation therapy: Secondary | ICD-10-CM | POA: Diagnosis not present

## 2016-05-07 DIAGNOSIS — C7951 Secondary malignant neoplasm of bone: Secondary | ICD-10-CM | POA: Diagnosis not present

## 2016-05-08 DIAGNOSIS — Z510 Encounter for antineoplastic radiation therapy: Secondary | ICD-10-CM | POA: Diagnosis not present

## 2016-05-08 DIAGNOSIS — C679 Malignant neoplasm of bladder, unspecified: Secondary | ICD-10-CM | POA: Diagnosis not present

## 2016-05-08 DIAGNOSIS — C7951 Secondary malignant neoplasm of bone: Secondary | ICD-10-CM | POA: Diagnosis not present

## 2016-05-09 DIAGNOSIS — C679 Malignant neoplasm of bladder, unspecified: Secondary | ICD-10-CM | POA: Diagnosis not present

## 2016-05-09 DIAGNOSIS — Z510 Encounter for antineoplastic radiation therapy: Secondary | ICD-10-CM | POA: Diagnosis not present

## 2016-05-09 DIAGNOSIS — C7951 Secondary malignant neoplasm of bone: Secondary | ICD-10-CM | POA: Diagnosis not present

## 2016-05-10 DIAGNOSIS — C679 Malignant neoplasm of bladder, unspecified: Secondary | ICD-10-CM | POA: Diagnosis not present

## 2016-05-10 DIAGNOSIS — C7951 Secondary malignant neoplasm of bone: Secondary | ICD-10-CM | POA: Diagnosis not present

## 2016-05-10 DIAGNOSIS — Z510 Encounter for antineoplastic radiation therapy: Secondary | ICD-10-CM | POA: Diagnosis not present

## 2016-05-11 DIAGNOSIS — C7951 Secondary malignant neoplasm of bone: Secondary | ICD-10-CM | POA: Diagnosis not present

## 2016-05-11 DIAGNOSIS — Z510 Encounter for antineoplastic radiation therapy: Secondary | ICD-10-CM | POA: Diagnosis not present

## 2016-05-11 DIAGNOSIS — C679 Malignant neoplasm of bladder, unspecified: Secondary | ICD-10-CM | POA: Diagnosis not present

## 2016-05-14 DIAGNOSIS — C7951 Secondary malignant neoplasm of bone: Secondary | ICD-10-CM | POA: Diagnosis not present

## 2016-05-14 DIAGNOSIS — C679 Malignant neoplasm of bladder, unspecified: Secondary | ICD-10-CM | POA: Diagnosis not present

## 2016-05-14 DIAGNOSIS — Z510 Encounter for antineoplastic radiation therapy: Secondary | ICD-10-CM | POA: Diagnosis not present

## 2016-05-15 DIAGNOSIS — Z510 Encounter for antineoplastic radiation therapy: Secondary | ICD-10-CM | POA: Diagnosis not present

## 2016-05-15 DIAGNOSIS — C679 Malignant neoplasm of bladder, unspecified: Secondary | ICD-10-CM | POA: Diagnosis not present

## 2016-05-15 DIAGNOSIS — C7951 Secondary malignant neoplasm of bone: Secondary | ICD-10-CM | POA: Diagnosis not present

## 2016-05-16 DIAGNOSIS — Z79899 Other long term (current) drug therapy: Secondary | ICD-10-CM | POA: Diagnosis not present

## 2016-05-16 DIAGNOSIS — C679 Malignant neoplasm of bladder, unspecified: Secondary | ICD-10-CM | POA: Diagnosis not present

## 2016-05-16 DIAGNOSIS — R918 Other nonspecific abnormal finding of lung field: Secondary | ICD-10-CM | POA: Diagnosis not present

## 2016-05-16 DIAGNOSIS — C7951 Secondary malignant neoplasm of bone: Secondary | ICD-10-CM | POA: Diagnosis not present

## 2016-05-16 DIAGNOSIS — R9341 Abnormal radiologic findings on diagnostic imaging of renal pelvis, ureter, or bladder: Secondary | ICD-10-CM | POA: Diagnosis not present

## 2016-05-17 DIAGNOSIS — C7951 Secondary malignant neoplasm of bone: Secondary | ICD-10-CM | POA: Diagnosis not present

## 2016-05-17 DIAGNOSIS — C799 Secondary malignant neoplasm of unspecified site: Secondary | ICD-10-CM | POA: Diagnosis not present

## 2016-05-17 DIAGNOSIS — C679 Malignant neoplasm of bladder, unspecified: Secondary | ICD-10-CM | POA: Diagnosis not present

## 2016-06-01 DIAGNOSIS — C679 Malignant neoplasm of bladder, unspecified: Secondary | ICD-10-CM | POA: Diagnosis not present

## 2016-06-01 DIAGNOSIS — C799 Secondary malignant neoplasm of unspecified site: Secondary | ICD-10-CM | POA: Diagnosis not present

## 2016-06-01 DIAGNOSIS — C7800 Secondary malignant neoplasm of unspecified lung: Secondary | ICD-10-CM | POA: Diagnosis not present

## 2016-06-01 DIAGNOSIS — F17210 Nicotine dependence, cigarettes, uncomplicated: Secondary | ICD-10-CM | POA: Diagnosis not present

## 2016-06-01 DIAGNOSIS — C7951 Secondary malignant neoplasm of bone: Secondary | ICD-10-CM | POA: Diagnosis not present

## 2016-06-01 DIAGNOSIS — C775 Secondary and unspecified malignant neoplasm of intrapelvic lymph nodes: Secondary | ICD-10-CM | POA: Diagnosis not present

## 2016-06-01 DIAGNOSIS — R6889 Other general symptoms and signs: Secondary | ICD-10-CM | POA: Diagnosis not present

## 2016-06-07 DIAGNOSIS — C674 Malignant neoplasm of posterior wall of bladder: Secondary | ICD-10-CM | POA: Diagnosis not present

## 2016-06-07 DIAGNOSIS — C679 Malignant neoplasm of bladder, unspecified: Secondary | ICD-10-CM | POA: Diagnosis not present

## 2016-06-07 DIAGNOSIS — C7951 Secondary malignant neoplasm of bone: Secondary | ICD-10-CM | POA: Diagnosis not present

## 2016-06-14 DIAGNOSIS — C679 Malignant neoplasm of bladder, unspecified: Secondary | ICD-10-CM | POA: Diagnosis not present

## 2016-06-14 DIAGNOSIS — Z79899 Other long term (current) drug therapy: Secondary | ICD-10-CM | POA: Diagnosis not present

## 2016-06-14 DIAGNOSIS — C7951 Secondary malignant neoplasm of bone: Secondary | ICD-10-CM | POA: Diagnosis not present

## 2016-06-22 DIAGNOSIS — Z906 Acquired absence of other parts of urinary tract: Secondary | ICD-10-CM | POA: Diagnosis not present

## 2016-06-22 DIAGNOSIS — C7951 Secondary malignant neoplasm of bone: Secondary | ICD-10-CM | POA: Diagnosis not present

## 2016-06-22 DIAGNOSIS — R935 Abnormal findings on diagnostic imaging of other abdominal regions, including retroperitoneum: Secondary | ICD-10-CM | POA: Diagnosis not present

## 2016-06-22 DIAGNOSIS — Z9079 Acquired absence of other genital organ(s): Secondary | ICD-10-CM | POA: Diagnosis not present

## 2016-06-22 DIAGNOSIS — R918 Other nonspecific abnormal finding of lung field: Secondary | ICD-10-CM | POA: Diagnosis not present

## 2016-06-22 DIAGNOSIS — C772 Secondary and unspecified malignant neoplasm of intra-abdominal lymph nodes: Secondary | ICD-10-CM | POA: Diagnosis not present

## 2016-06-22 DIAGNOSIS — C679 Malignant neoplasm of bladder, unspecified: Secondary | ICD-10-CM | POA: Diagnosis not present

## 2016-06-27 ENCOUNTER — Other Ambulatory Visit: Payer: Self-pay | Admitting: Internal Medicine

## 2016-06-28 DIAGNOSIS — I739 Peripheral vascular disease, unspecified: Secondary | ICD-10-CM | POA: Diagnosis not present

## 2016-06-28 DIAGNOSIS — Z48812 Encounter for surgical aftercare following surgery on the circulatory system: Secondary | ICD-10-CM | POA: Diagnosis not present

## 2016-07-05 DIAGNOSIS — Z923 Personal history of irradiation: Secondary | ICD-10-CM | POA: Diagnosis not present

## 2016-07-05 DIAGNOSIS — C679 Malignant neoplasm of bladder, unspecified: Secondary | ICD-10-CM | POA: Diagnosis not present

## 2016-07-05 DIAGNOSIS — C7802 Secondary malignant neoplasm of left lung: Secondary | ICD-10-CM | POA: Diagnosis not present

## 2016-07-05 DIAGNOSIS — C775 Secondary and unspecified malignant neoplasm of intrapelvic lymph nodes: Secondary | ICD-10-CM | POA: Diagnosis not present

## 2016-07-05 DIAGNOSIS — R112 Nausea with vomiting, unspecified: Secondary | ICD-10-CM | POA: Diagnosis not present

## 2016-07-05 DIAGNOSIS — Z5111 Encounter for antineoplastic chemotherapy: Secondary | ICD-10-CM | POA: Diagnosis not present

## 2016-07-05 DIAGNOSIS — D649 Anemia, unspecified: Secondary | ICD-10-CM | POA: Diagnosis not present

## 2016-07-05 DIAGNOSIS — F17210 Nicotine dependence, cigarettes, uncomplicated: Secondary | ICD-10-CM | POA: Diagnosis not present

## 2016-07-05 DIAGNOSIS — C799 Secondary malignant neoplasm of unspecified site: Secondary | ICD-10-CM | POA: Diagnosis not present

## 2016-07-05 DIAGNOSIS — M898X9 Other specified disorders of bone, unspecified site: Secondary | ICD-10-CM | POA: Diagnosis not present

## 2016-07-05 DIAGNOSIS — C7951 Secondary malignant neoplasm of bone: Secondary | ICD-10-CM | POA: Diagnosis not present

## 2016-07-09 DIAGNOSIS — Z5181 Encounter for therapeutic drug level monitoring: Secondary | ICD-10-CM | POA: Diagnosis not present

## 2016-07-09 DIAGNOSIS — Z79899 Other long term (current) drug therapy: Secondary | ICD-10-CM | POA: Diagnosis not present

## 2016-07-10 DIAGNOSIS — Z5181 Encounter for therapeutic drug level monitoring: Secondary | ICD-10-CM | POA: Diagnosis not present

## 2016-07-10 DIAGNOSIS — Z79899 Other long term (current) drug therapy: Secondary | ICD-10-CM | POA: Diagnosis not present

## 2016-07-13 DIAGNOSIS — C679 Malignant neoplasm of bladder, unspecified: Secondary | ICD-10-CM | POA: Diagnosis not present

## 2016-07-13 DIAGNOSIS — C7951 Secondary malignant neoplasm of bone: Secondary | ICD-10-CM | POA: Diagnosis not present

## 2016-07-13 DIAGNOSIS — A419 Sepsis, unspecified organism: Secondary | ICD-10-CM | POA: Diagnosis not present

## 2016-07-13 DIAGNOSIS — E43 Unspecified severe protein-calorie malnutrition: Secondary | ICD-10-CM | POA: Diagnosis not present

## 2016-07-13 DIAGNOSIS — K56699 Other intestinal obstruction unspecified as to partial versus complete obstruction: Secondary | ICD-10-CM | POA: Diagnosis not present

## 2016-07-13 DIAGNOSIS — C799 Secondary malignant neoplasm of unspecified site: Secondary | ICD-10-CM | POA: Diagnosis not present

## 2016-07-13 DIAGNOSIS — R1084 Generalized abdominal pain: Secondary | ICD-10-CM | POA: Diagnosis not present

## 2016-07-13 DIAGNOSIS — K565 Intestinal adhesions [bands], unspecified as to partial versus complete obstruction: Secondary | ICD-10-CM | POA: Diagnosis not present

## 2016-07-13 DIAGNOSIS — J690 Pneumonitis due to inhalation of food and vomit: Secondary | ICD-10-CM | POA: Diagnosis not present

## 2016-07-13 DIAGNOSIS — E871 Hypo-osmolality and hyponatremia: Secondary | ICD-10-CM | POA: Diagnosis not present

## 2016-07-16 ENCOUNTER — Other Ambulatory Visit: Payer: Self-pay | Admitting: Internal Medicine

## 2016-07-16 DIAGNOSIS — R109 Unspecified abdominal pain: Secondary | ICD-10-CM | POA: Diagnosis not present

## 2016-07-16 DIAGNOSIS — F17200 Nicotine dependence, unspecified, uncomplicated: Secondary | ICD-10-CM | POA: Diagnosis not present

## 2016-07-16 DIAGNOSIS — T402X5A Adverse effect of other opioids, initial encounter: Secondary | ICD-10-CM | POA: Diagnosis not present

## 2016-07-16 DIAGNOSIS — C679 Malignant neoplasm of bladder, unspecified: Secondary | ICD-10-CM | POA: Diagnosis present

## 2016-07-16 DIAGNOSIS — C678 Malignant neoplasm of overlapping sites of bladder: Secondary | ICD-10-CM | POA: Diagnosis not present

## 2016-07-16 DIAGNOSIS — C689 Malignant neoplasm of urinary organ, unspecified: Secondary | ICD-10-CM | POA: Diagnosis not present

## 2016-07-16 DIAGNOSIS — R5381 Other malaise: Secondary | ICD-10-CM | POA: Diagnosis not present

## 2016-07-16 DIAGNOSIS — K565 Intestinal adhesions [bands], unspecified as to partial versus complete obstruction: Secondary | ICD-10-CM | POA: Diagnosis not present

## 2016-07-16 DIAGNOSIS — G43A0 Cyclical vomiting, not intractable: Secondary | ICD-10-CM | POA: Diagnosis not present

## 2016-07-16 DIAGNOSIS — K210 Gastro-esophageal reflux disease with esophagitis: Secondary | ICD-10-CM | POA: Diagnosis present

## 2016-07-16 DIAGNOSIS — G893 Neoplasm related pain (acute) (chronic): Secondary | ICD-10-CM | POA: Diagnosis not present

## 2016-07-16 DIAGNOSIS — R1084 Generalized abdominal pain: Secondary | ICD-10-CM | POA: Diagnosis not present

## 2016-07-16 DIAGNOSIS — C7802 Secondary malignant neoplasm of left lung: Secondary | ICD-10-CM | POA: Diagnosis present

## 2016-07-16 DIAGNOSIS — E860 Dehydration: Secondary | ICD-10-CM | POA: Diagnosis present

## 2016-07-16 DIAGNOSIS — F17210 Nicotine dependence, cigarettes, uncomplicated: Secondary | ICD-10-CM | POA: Diagnosis present

## 2016-07-16 DIAGNOSIS — I2510 Atherosclerotic heart disease of native coronary artery without angina pectoris: Secondary | ICD-10-CM | POA: Diagnosis present

## 2016-07-16 DIAGNOSIS — K56609 Unspecified intestinal obstruction, unspecified as to partial versus complete obstruction: Secondary | ICD-10-CM | POA: Diagnosis not present

## 2016-07-16 DIAGNOSIS — C7801 Secondary malignant neoplasm of right lung: Secondary | ICD-10-CM | POA: Diagnosis present

## 2016-07-16 DIAGNOSIS — C674 Malignant neoplasm of posterior wall of bladder: Secondary | ICD-10-CM | POA: Diagnosis not present

## 2016-07-16 DIAGNOSIS — K5903 Drug induced constipation: Secondary | ICD-10-CM | POA: Diagnosis not present

## 2016-07-16 DIAGNOSIS — C7951 Secondary malignant neoplasm of bone: Secondary | ICD-10-CM | POA: Diagnosis present

## 2016-07-16 DIAGNOSIS — Z515 Encounter for palliative care: Secondary | ICD-10-CM | POA: Diagnosis not present

## 2016-07-16 DIAGNOSIS — Z4682 Encounter for fitting and adjustment of non-vascular catheter: Secondary | ICD-10-CM | POA: Diagnosis not present

## 2016-07-16 DIAGNOSIS — J9811 Atelectasis: Secondary | ICD-10-CM | POA: Diagnosis not present

## 2016-07-16 DIAGNOSIS — Z681 Body mass index (BMI) 19 or less, adult: Secondary | ICD-10-CM | POA: Diagnosis not present

## 2016-07-16 DIAGNOSIS — E43 Unspecified severe protein-calorie malnutrition: Secondary | ICD-10-CM | POA: Diagnosis present

## 2016-07-16 DIAGNOSIS — J690 Pneumonitis due to inhalation of food and vomit: Secondary | ICD-10-CM | POA: Diagnosis not present

## 2016-07-16 DIAGNOSIS — D638 Anemia in other chronic diseases classified elsewhere: Secondary | ICD-10-CM | POA: Diagnosis present

## 2016-07-16 DIAGNOSIS — I10 Essential (primary) hypertension: Secondary | ICD-10-CM | POA: Diagnosis present

## 2016-07-16 DIAGNOSIS — D509 Iron deficiency anemia, unspecified: Secondary | ICD-10-CM | POA: Diagnosis present

## 2016-07-16 DIAGNOSIS — R110 Nausea: Secondary | ICD-10-CM | POA: Diagnosis not present

## 2016-07-16 DIAGNOSIS — E7800 Pure hypercholesterolemia, unspecified: Secondary | ICD-10-CM | POA: Diagnosis present

## 2016-07-16 DIAGNOSIS — A419 Sepsis, unspecified organism: Secondary | ICD-10-CM | POA: Diagnosis not present

## 2016-07-16 DIAGNOSIS — R64 Cachexia: Secondary | ICD-10-CM | POA: Diagnosis present

## 2016-07-16 DIAGNOSIS — E861 Hypovolemia: Secondary | ICD-10-CM | POA: Diagnosis present

## 2016-07-16 DIAGNOSIS — Z66 Do not resuscitate: Secondary | ICD-10-CM | POA: Diagnosis not present

## 2016-07-16 DIAGNOSIS — K921 Melena: Secondary | ICD-10-CM | POA: Diagnosis not present

## 2016-07-16 DIAGNOSIS — K56699 Other intestinal obstruction unspecified as to partial versus complete obstruction: Secondary | ICD-10-CM | POA: Diagnosis not present

## 2016-07-16 DIAGNOSIS — C775 Secondary and unspecified malignant neoplasm of intrapelvic lymph nodes: Secondary | ICD-10-CM | POA: Diagnosis present

## 2016-07-16 DIAGNOSIS — E871 Hypo-osmolality and hyponatremia: Secondary | ICD-10-CM | POA: Diagnosis present

## 2016-07-20 IMAGING — US ULTRASOUND CORE BIOPSY
1 series · 13 of 13 positions shown · non-contrast
Comparison: none

CLINICAL DATA: 21 mm left thyroid nodule incidentally noted on CT
chest

EXAM:
ULTRASOUND-GUIDED THYROID ASPIRATION BIOPSY
TECHNIQUE: The procedure, risks (including but not limited to bleeding,
infection, organ damage ), benefits, and alternatives were explained
to the patient. Questions regarding the procedure were encouraged
and answered. The patient understands and consents to the procedure.

[Series 1: ultrasound core biopsy · 0.07mm/px · 13 of 13 slices shown]
[im 1/13]
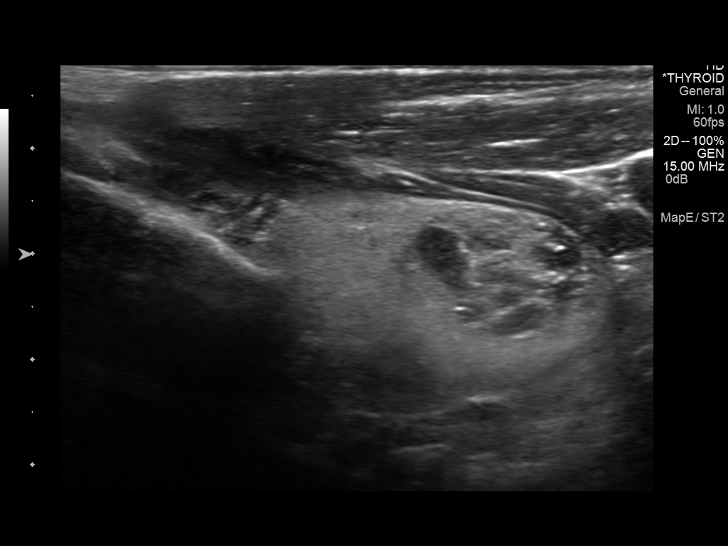
[im 2/13]
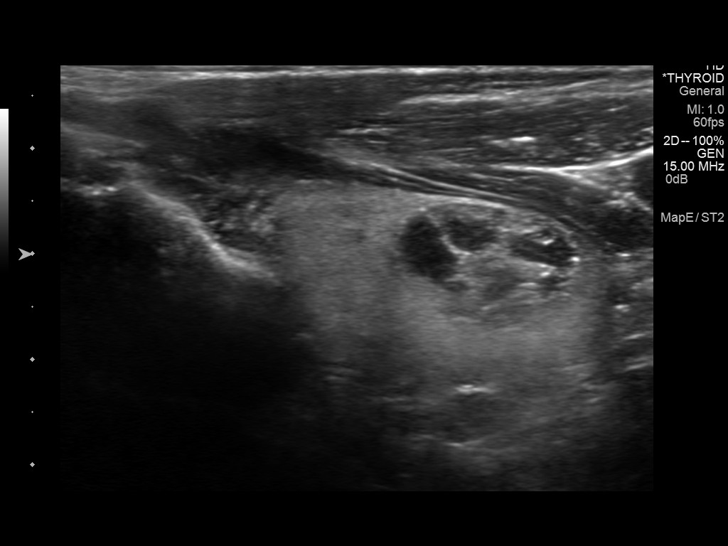
[im 3/13]
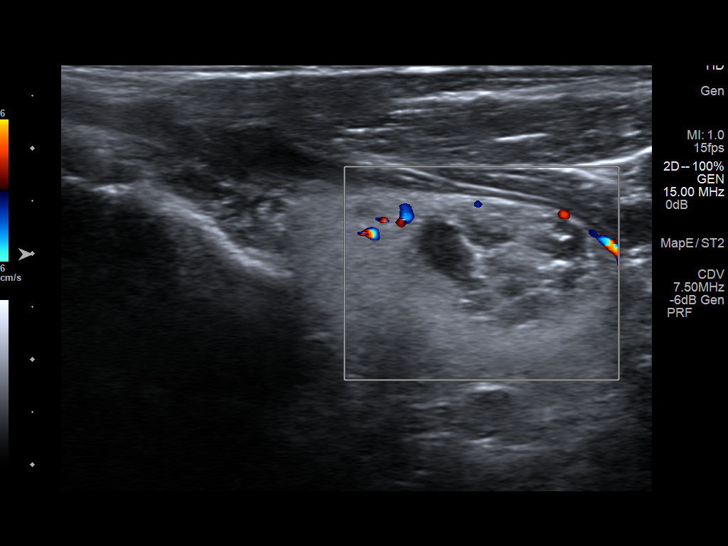
[im 4/13]
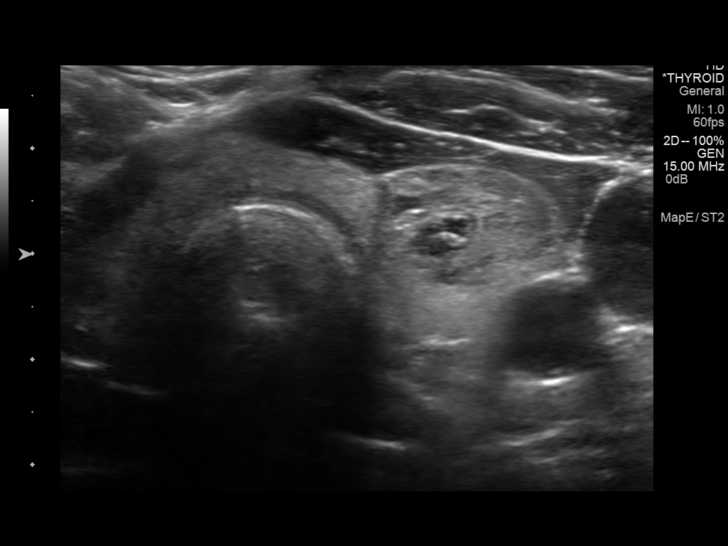
[im 5/13]
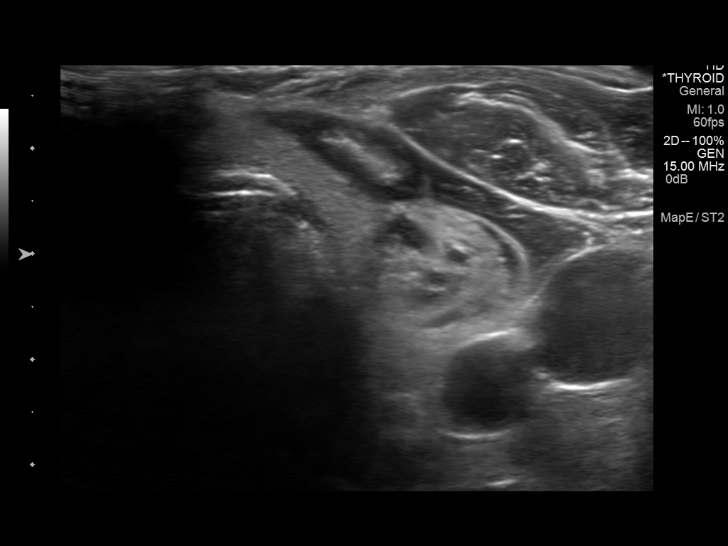
[im 6/13]
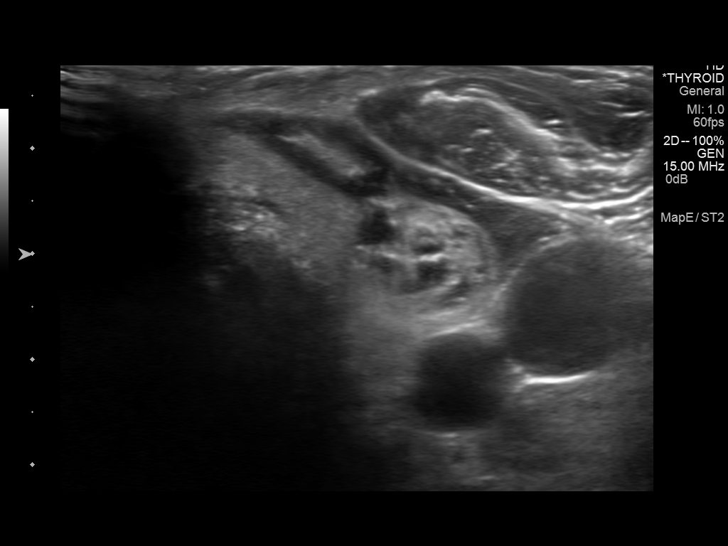
[im 7/13]
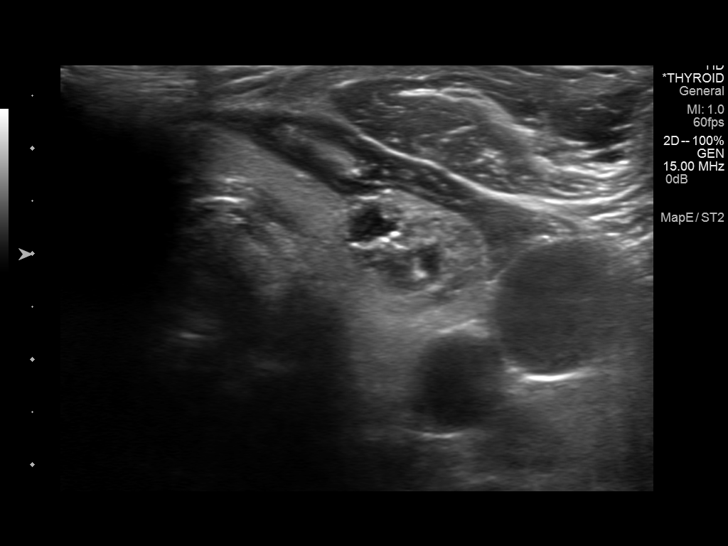
[im 8/13]
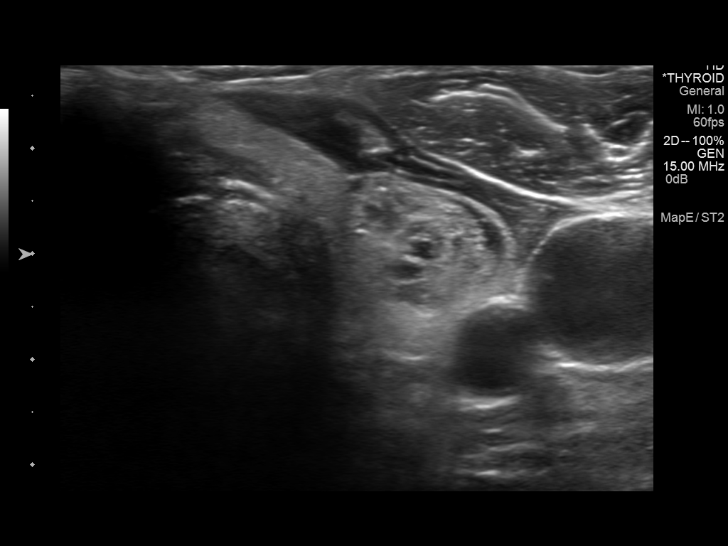
[im 9/13]
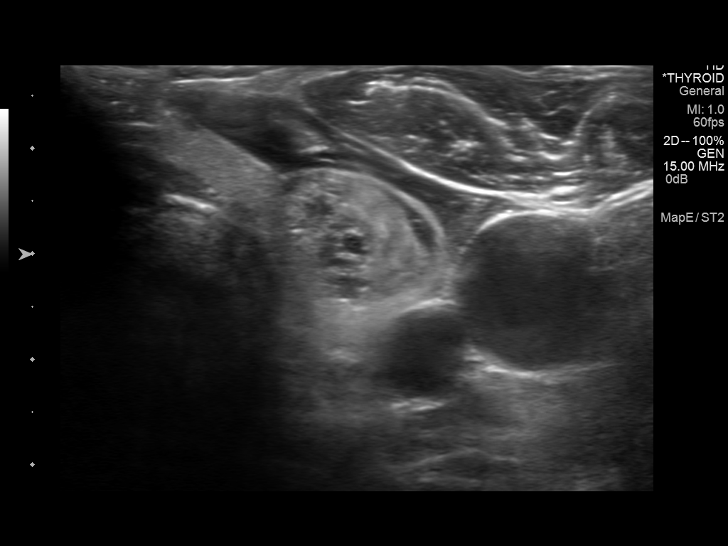
[im 10/13]
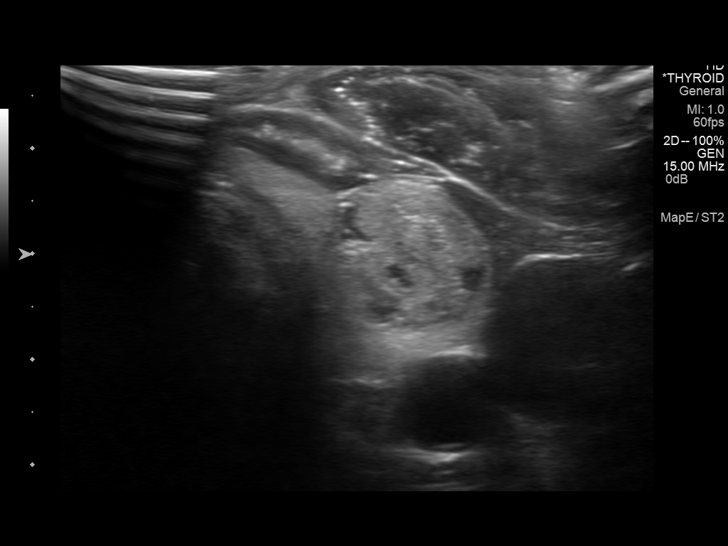
[im 11/13]
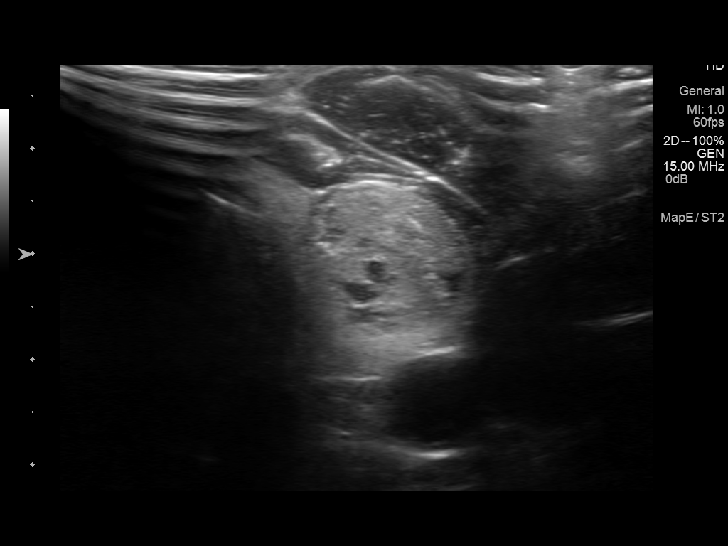
[im 12/13]
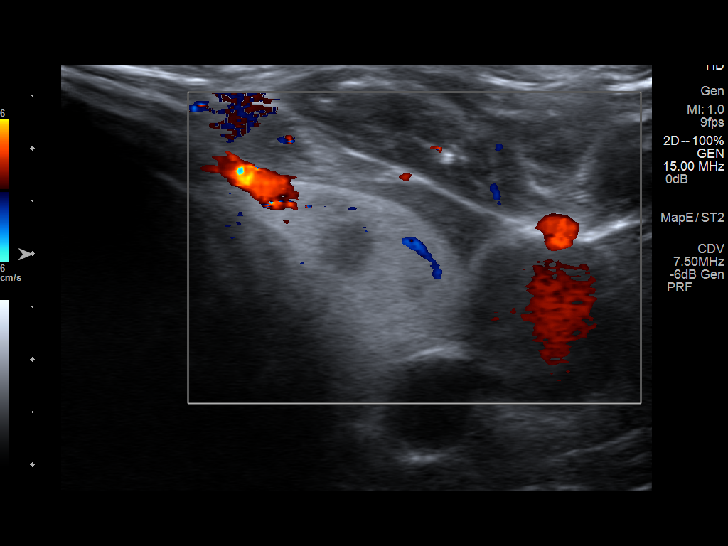
[im 13/13]
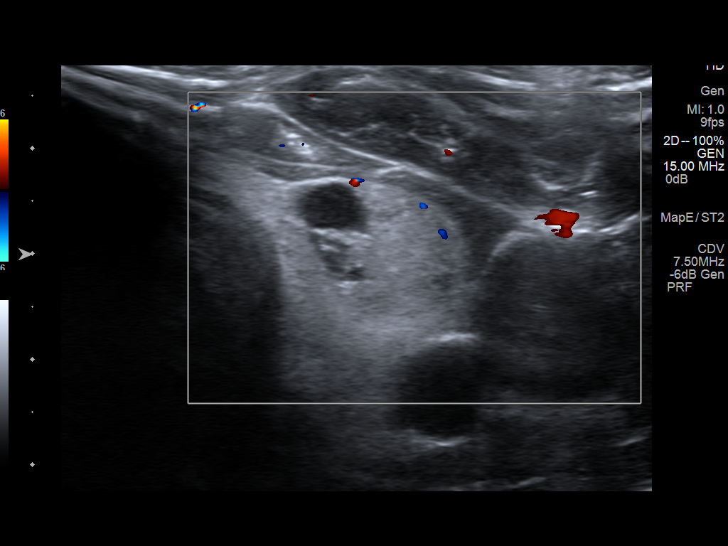

[13 of 13 positions shown; findings below may reference images not displayed]

Survey ultrasound was performed and the dominant lesion in the LEFT
lobe was localized. An appropriate skin entry site was determined.
Skin was marked, then prepped with Betadine, draped in usual sterile
fashion, and infiltrated locally with 1% lidocaine. Under real-time
ultrasound guidance, 5 passes were made into the lesion with 25
gauge needles. Quick stain analysis was adequate. The patient
tolerated procedure well.

COMPLICATIONS:
COMPLICATIONS
none
IMPRESSION: 1. Technically successful ultrasound-guided thyroid aspiration
biopsy dominant left nodule.

## 2016-07-24 ENCOUNTER — Telehealth: Payer: Self-pay

## 2016-07-24 NOTE — Telephone Encounter (Signed)
Informed celcelia at [facility] and she stated she will get them to get Hospice MD on orders.

## 2016-07-24 NOTE — Telephone Encounter (Signed)
Ceclia from [facility] called stating pt has been in the hospital but will be discharged later this week. Wants to call and find out if Dr. Army Melia will continue his [facility] follow ups for at home outside of the hospital. Please Advise.  Her callback number is [phone].

## 2016-07-24 NOTE — Telephone Encounter (Signed)
Please advise that I would like the Hospice MD to take over his orders.

## 2016-07-25 DIAGNOSIS — K56609 Unspecified intestinal obstruction, unspecified as to partial versus complete obstruction: Secondary | ICD-10-CM | POA: Diagnosis not present

## 2016-08-08 DEATH — deceased

## 2016-09-22 IMAGING — CT CT CHEST W/O CM
2 of 3 series · 15 of 36 positions shown, 18 images · non-contrast
Comparison: 02/24/2014

CLINICAL DATA: Pulmonary nodule.  History of bladder cancer.

EXAM:
CT CHEST WITHOUT CONTRAST
TECHNIQUE: Multidetector CT imaging of the chest was performed following the
standard protocol without IV contrast.

[Series 2: soft tissue · axial · 0.69mm/px · z∈[-577,-242]mm · 12 of 79 slices shown, 15 images]
[im 6/79  mediastinal]
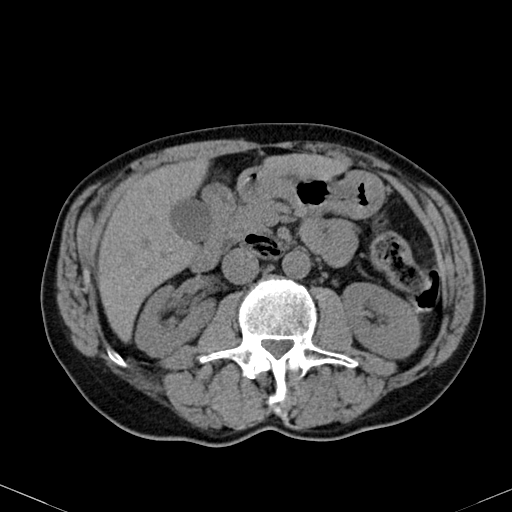
[im 6/79  lung]
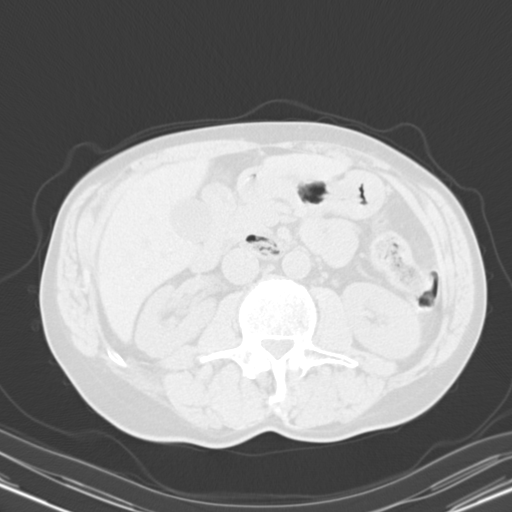
[im 12/79  lung]
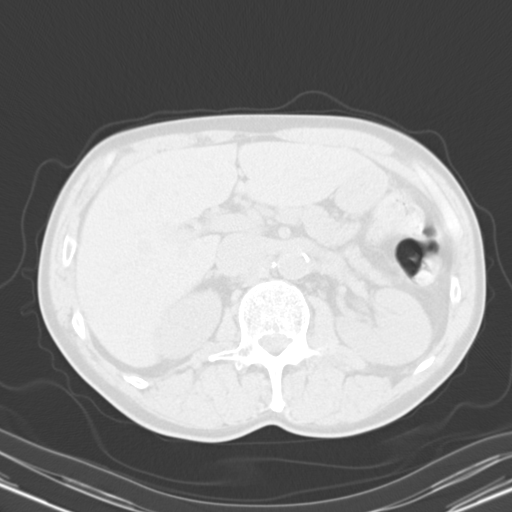
[im 18/79  lung]
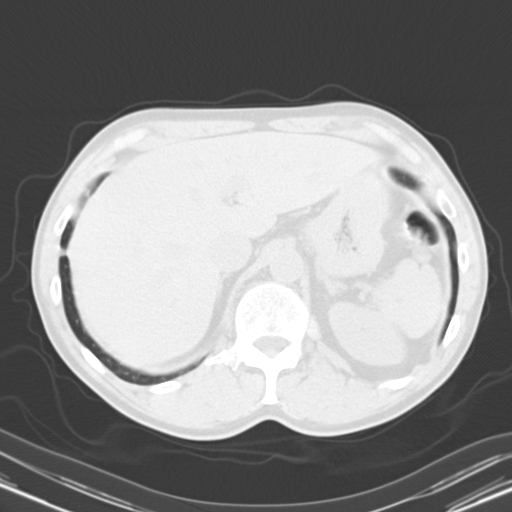
[im 24/79  lung]
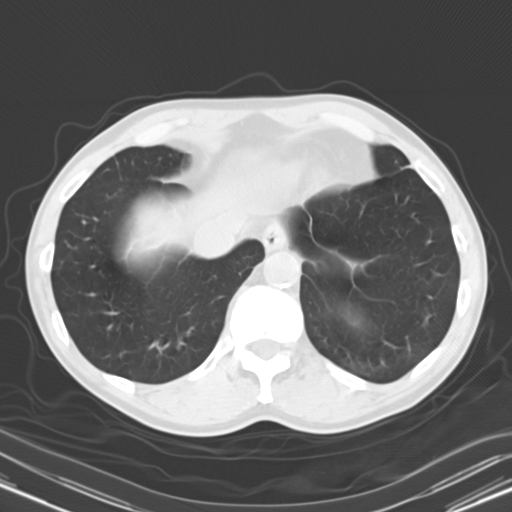
[im 29/79  mediastinal]
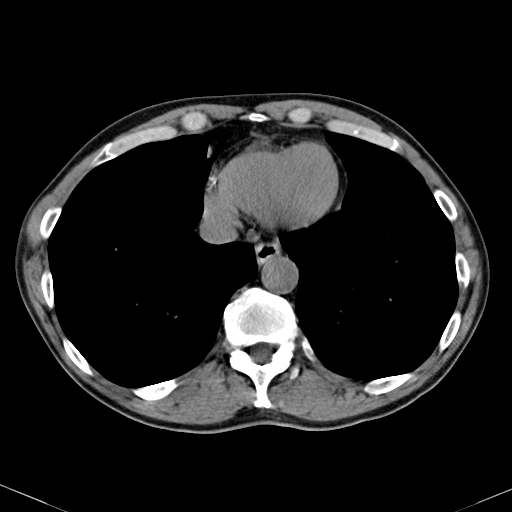
[im 29/79  lung]
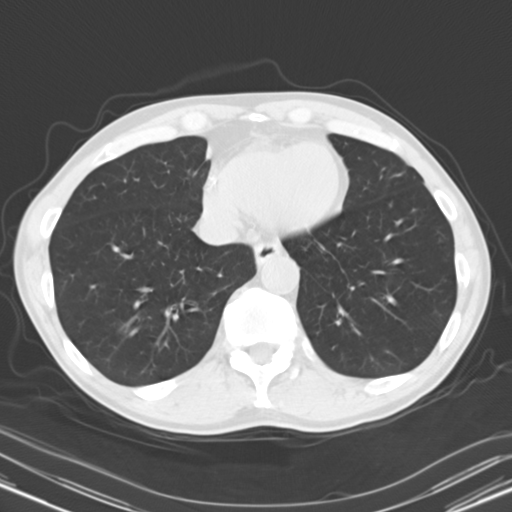
[im 35/79  lung]
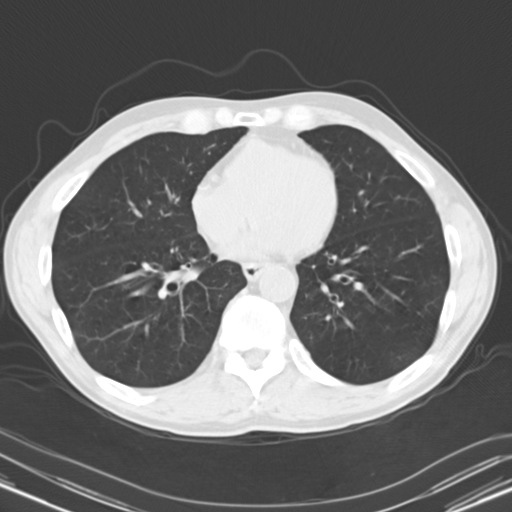
[im 44/79  lung]
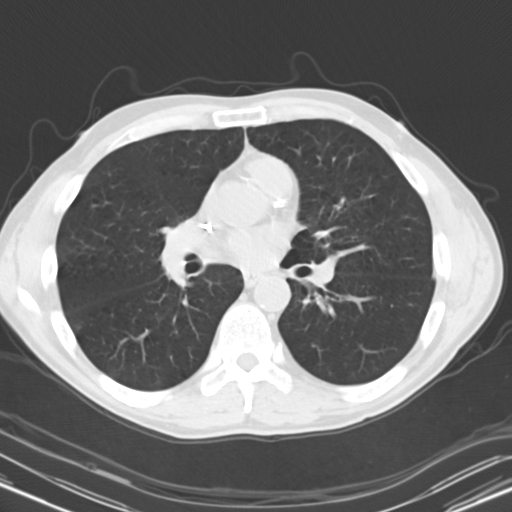
[im 50/79  lung]
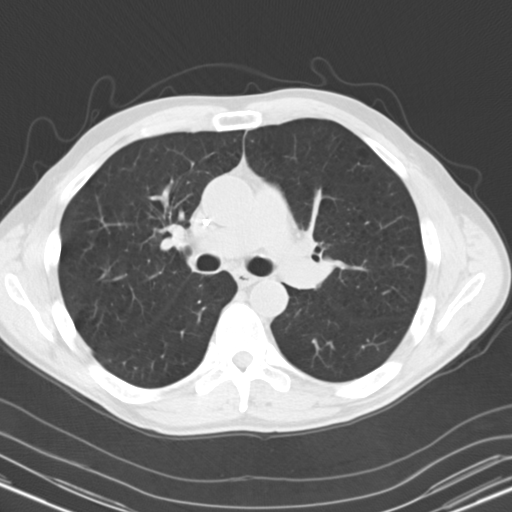
[im 55/79  mediastinal]
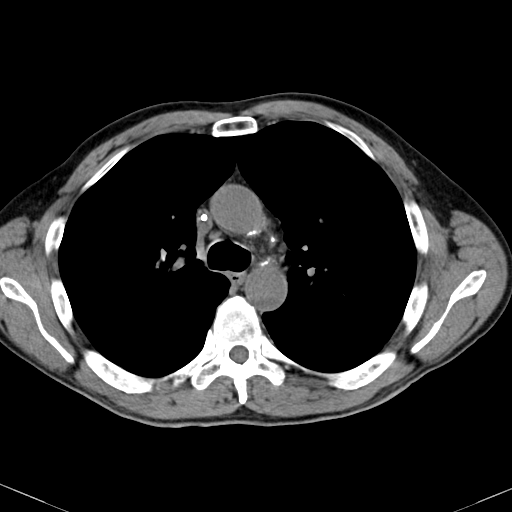
[im 55/79  lung]
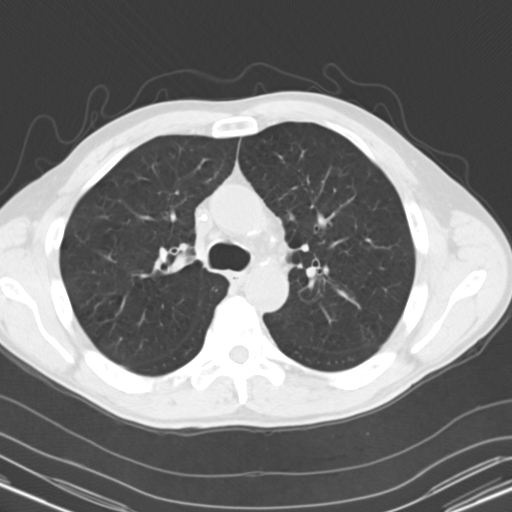
[im 61/79  lung]
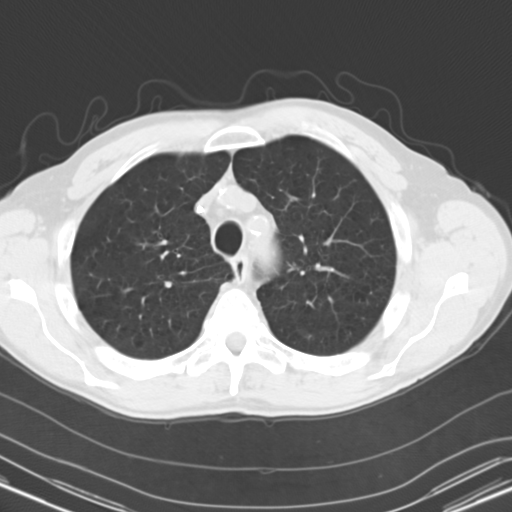
[im 67/79  lung]
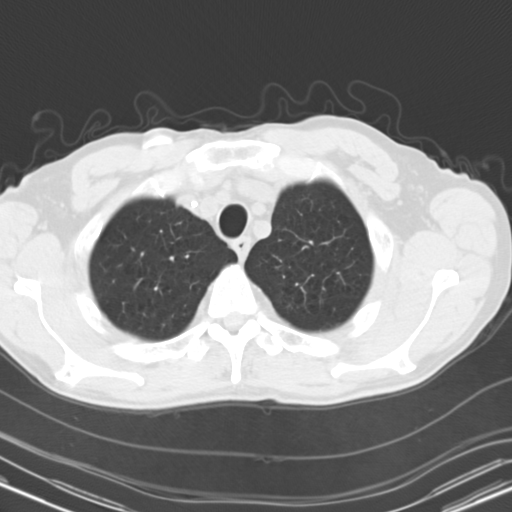
[im 73/79  lung]
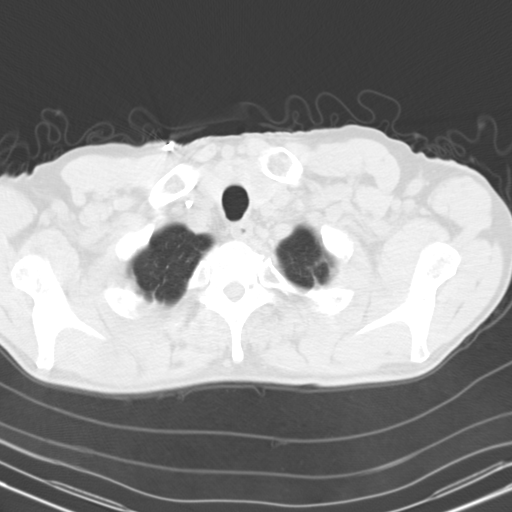

[Series 602: coronals · coronal · 0.74mm/px · 3 of 123 slices shown]
[im 25/123  lung]
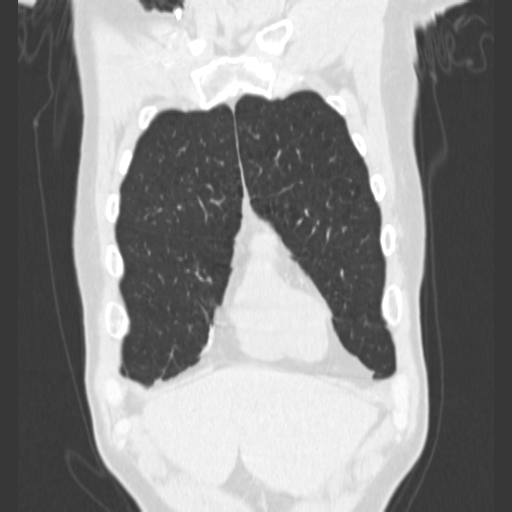
[im 49/123  lung]
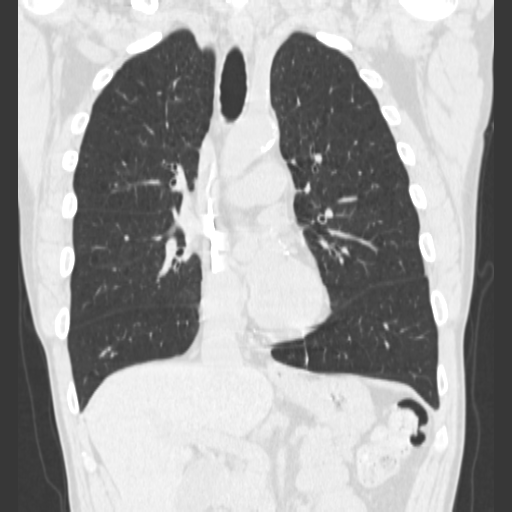
[im 74/123  lung]
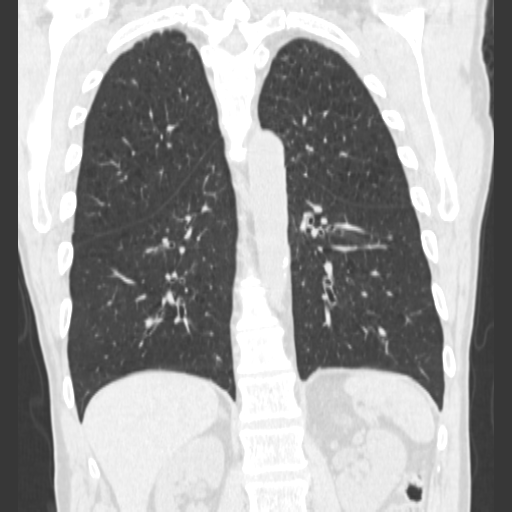

[15 of 36 positions shown; findings below may reference images not displayed]

FINDINGS: Mediastinum/Nodes: Low-density left thyroid nodule, recently worked
up with thyroid ultrasound and fine needle aspiration.

Right Port-A-Cath tip:  Cavoatrial junction.

Coronary, aortic arch, and branch vessel atherosclerosis.

No pathologic thoracic adenopathy.

Lungs/Pleura: Mild biapical pleural parenchymal scarring.
Centrilobular emphysema. 3 by 4 mm right middle lobe pulmonary
nodule, image 50 series 3, stable from 05/05/2012. 4 by 3 mm left
upper lobe pulmonary nodule, image 17 series 3, stable from
02/24/2014.

Upper abdomen: Unremarkable

Musculoskeletal: Unremarkable
IMPRESSION: 1. 3 by 4 mm pulmonary nodule posteriorly in the left upper lobe,
stable from 02/24/2014. This documents 3 months of stability.
Typical Fleischner criteria for follow up do not apply due to the
history of bladder cancer. Consider followup chest CT in 6-12 months
time.
2. The similarly sized right middle lobe nodule adjacent to the
major fissure is stable from 9664 and considered benign.
3. Centrilobular emphysema.
4. Atherosclerosis.

## 2016-10-18 ENCOUNTER — Ambulatory Visit: Payer: Medicare Other | Admitting: Internal Medicine
